# Patient Record
Sex: Female | Born: 1965 | Race: Black or African American | Hispanic: No | Smoking: Former smoker
Health system: Southern US, Community
[De-identification: ages and names within clinical notes are randomized; demographics above are authoritative.]

## PROBLEM LIST (undated history)

## (undated) DIAGNOSIS — I639 Cerebral infarction, unspecified: Secondary | ICD-10-CM

## (undated) DIAGNOSIS — E663 Overweight: Secondary | ICD-10-CM

## (undated) DIAGNOSIS — F4310 Post-traumatic stress disorder, unspecified: Secondary | ICD-10-CM

## (undated) DIAGNOSIS — I209 Angina pectoris, unspecified: Secondary | ICD-10-CM

## (undated) DIAGNOSIS — F329 Major depressive disorder, single episode, unspecified: Secondary | ICD-10-CM

## (undated) DIAGNOSIS — E538 Deficiency of other specified B group vitamins: Secondary | ICD-10-CM

## (undated) DIAGNOSIS — R32 Unspecified urinary incontinence: Secondary | ICD-10-CM

## (undated) DIAGNOSIS — R200 Anesthesia of skin: Secondary | ICD-10-CM

## (undated) DIAGNOSIS — Z6830 Body mass index (BMI) 30.0-30.9, adult: Secondary | ICD-10-CM

## (undated) DIAGNOSIS — I10 Essential (primary) hypertension: Secondary | ICD-10-CM

## (undated) DIAGNOSIS — E785 Hyperlipidemia, unspecified: Secondary | ICD-10-CM

## (undated) DIAGNOSIS — G43909 Migraine, unspecified, not intractable, without status migrainosus: Secondary | ICD-10-CM

## (undated) DIAGNOSIS — D649 Anemia, unspecified: Secondary | ICD-10-CM

## (undated) DIAGNOSIS — F32A Depression, unspecified: Secondary | ICD-10-CM

## (undated) DIAGNOSIS — J45909 Unspecified asthma, uncomplicated: Secondary | ICD-10-CM

## (undated) DIAGNOSIS — K047 Periapical abscess without sinus: Secondary | ICD-10-CM

## (undated) DIAGNOSIS — F1020 Alcohol dependence, uncomplicated: Secondary | ICD-10-CM

## (undated) DIAGNOSIS — E559 Vitamin D deficiency, unspecified: Secondary | ICD-10-CM

## (undated) DIAGNOSIS — L987 Excessive and redundant skin and subcutaneous tissue: Secondary | ICD-10-CM

## (undated) DIAGNOSIS — M542 Cervicalgia: Secondary | ICD-10-CM

## (undated) DIAGNOSIS — H3552 Pigmentary retinal dystrophy: Secondary | ICD-10-CM

## (undated) DIAGNOSIS — H547 Unspecified visual loss: Secondary | ICD-10-CM

## (undated) DIAGNOSIS — H9190 Unspecified hearing loss, unspecified ear: Secondary | ICD-10-CM

## (undated) DIAGNOSIS — F4321 Adjustment disorder with depressed mood: Secondary | ICD-10-CM

## (undated) DIAGNOSIS — G8929 Other chronic pain: Secondary | ICD-10-CM

## (undated) DIAGNOSIS — E213 Hyperparathyroidism, unspecified: Secondary | ICD-10-CM

## (undated) DIAGNOSIS — H5710 Ocular pain, unspecified eye: Secondary | ICD-10-CM

## (undated) DIAGNOSIS — H903 Sensorineural hearing loss, bilateral: Secondary | ICD-10-CM

## (undated) HISTORY — DX: Deficiency of other specified B group vitamins: E53.8

## (undated) HISTORY — DX: Excessive and redundant skin and subcutaneous tissue: L98.7

## (undated) HISTORY — PX: CHOLECYSTECTOMY: SHX55

## (undated) HISTORY — DX: Unspecified visual loss: H54.7

## (undated) HISTORY — DX: Periapical abscess without sinus: K04.7

## (undated) HISTORY — DX: Alcohol dependence, uncomplicated: F10.20

## (undated) HISTORY — DX: Depression, unspecified: F32.A

## (undated) HISTORY — DX: Cervicalgia: M54.2

## (undated) HISTORY — DX: Ocular pain, unspecified eye: H57.10

## (undated) HISTORY — DX: Angina pectoris, unspecified: I20.9

## (undated) HISTORY — DX: Pigmentary retinal dystrophy: H35.52

## (undated) HISTORY — DX: Hyperlipidemia, unspecified: E78.5

## (undated) HISTORY — DX: Unspecified hearing loss, unspecified ear: H91.90

## (undated) HISTORY — DX: Other chronic pain: G89.29

## (undated) HISTORY — DX: Overweight: E66.3

## (undated) HISTORY — DX: Post-traumatic stress disorder, unspecified: F43.10

## (undated) HISTORY — DX: Sensorineural hearing loss, bilateral: H90.3

## (undated) HISTORY — PX: TUBAL LIGATION: SHX77

## (undated) HISTORY — DX: Body mass index (BMI) 30.0-30.9, adult: Z68.30

## (undated) HISTORY — DX: Anemia, unspecified: D64.9

## (undated) HISTORY — DX: Adjustment disorder with depressed mood: F43.21

## (undated) HISTORY — PX: GASTRIC BYPASS: SHX52

## (undated) HISTORY — PX: COSMETIC SURGERY: SHX468

## (undated) HISTORY — DX: Vitamin D deficiency, unspecified: E55.9

## (undated) HISTORY — DX: Anesthesia of skin: R20.0

## (undated) HISTORY — PX: ABDOMINAL SURGERY: SHX537

## (undated) HISTORY — DX: Hyperparathyroidism, unspecified: E21.3

## (undated) HISTORY — DX: Unspecified urinary incontinence: R32

---

## 2015-12-21 ENCOUNTER — Emergency Department (HOSPITAL_COMMUNITY): Payer: Medicaid Other

## 2015-12-21 ENCOUNTER — Emergency Department (HOSPITAL_COMMUNITY)
Admission: EM | Admit: 2015-12-21 | Discharge: 2015-12-21 | Disposition: A | Discharge status: Home / Self Care | Payer: Medicaid Other | Attending: Emergency Medicine | Admitting: Emergency Medicine

## 2015-12-21 ENCOUNTER — Encounter (HOSPITAL_COMMUNITY): Payer: Self-pay | Admitting: Emergency Medicine

## 2015-12-21 DIAGNOSIS — N390 Urinary tract infection, site not specified: Secondary | ICD-10-CM | POA: Diagnosis not present

## 2015-12-21 DIAGNOSIS — I10 Essential (primary) hypertension: Secondary | ICD-10-CM | POA: Insufficient documentation

## 2015-12-21 DIAGNOSIS — D649 Anemia, unspecified: Secondary | ICD-10-CM | POA: Diagnosis not present

## 2015-12-21 DIAGNOSIS — N938 Other specified abnormal uterine and vaginal bleeding: Secondary | ICD-10-CM

## 2015-12-21 DIAGNOSIS — D259 Leiomyoma of uterus, unspecified: Secondary | ICD-10-CM | POA: Diagnosis not present

## 2015-12-21 DIAGNOSIS — R519 Headache, unspecified: Secondary | ICD-10-CM

## 2015-12-21 DIAGNOSIS — J45909 Unspecified asthma, uncomplicated: Secondary | ICD-10-CM | POA: Diagnosis not present

## 2015-12-21 DIAGNOSIS — R51 Headache: Secondary | ICD-10-CM

## 2015-12-21 DIAGNOSIS — R102 Pelvic and perineal pain: Secondary | ICD-10-CM

## 2015-12-21 HISTORY — DX: Migraine, unspecified, not intractable, without status migrainosus: G43.909

## 2015-12-21 HISTORY — DX: Unspecified asthma, uncomplicated: J45.909

## 2015-12-21 HISTORY — DX: Essential (primary) hypertension: I10

## 2015-12-21 LAB — URINALYSIS, ROUTINE W REFLEX MICROSCOPIC
Glucose, UA: NEGATIVE mg/dL
Ketones, ur: 15 mg/dL — AB
NITRITE: POSITIVE — AB
PROTEIN: 100 mg/dL — AB
Specific Gravity, Urine: 1.03 (ref 1.005–1.030)
pH: 5.5 (ref 5.0–8.0)

## 2015-12-21 LAB — CBC
HCT: 29.3 % — ABNORMAL LOW (ref 36.0–46.0)
Hemoglobin: 8.1 g/dL — ABNORMAL LOW (ref 12.0–15.0)
MCH: 16.2 pg — AB (ref 26.0–34.0)
MCHC: 27.6 g/dL — ABNORMAL LOW (ref 30.0–36.0)
MCV: 58.6 fL — ABNORMAL LOW (ref 78.0–100.0)
PLATELETS: 292 10*3/uL (ref 150–400)
RBC: 5 MIL/uL (ref 3.87–5.11)
RDW: 22.7 % — ABNORMAL HIGH (ref 11.5–15.5)
WBC: 7.3 10*3/uL (ref 4.0–10.5)

## 2015-12-21 LAB — URINE MICROSCOPIC-ADD ON

## 2015-12-21 LAB — COMPREHENSIVE METABOLIC PANEL
ALK PHOS: 107 U/L (ref 38–126)
ALT: 15 U/L (ref 14–54)
AST: 20 U/L (ref 15–41)
Albumin: 3.4 g/dL — ABNORMAL LOW (ref 3.5–5.0)
Anion gap: 7 (ref 5–15)
BUN: 14 mg/dL (ref 6–20)
CALCIUM: 9.4 mg/dL (ref 8.9–10.3)
CO2: 22 mmol/L (ref 22–32)
CREATININE: 0.62 mg/dL (ref 0.44–1.00)
Chloride: 107 mmol/L (ref 101–111)
Glucose, Bld: 99 mg/dL (ref 65–99)
Potassium: 3.3 mmol/L — ABNORMAL LOW (ref 3.5–5.1)
Sodium: 136 mmol/L (ref 135–145)
TOTAL PROTEIN: 6.3 g/dL — AB (ref 6.5–8.1)
Total Bilirubin: 1.1 mg/dL (ref 0.3–1.2)

## 2015-12-21 LAB — WET PREP, GENITAL
Clue Cells Wet Prep HPF POC: NONE SEEN
Sperm: NONE SEEN
Trich, Wet Prep: NONE SEEN
YEAST WET PREP: NONE SEEN

## 2015-12-21 LAB — POC URINE PREG, ED: Preg Test, Ur: NEGATIVE

## 2015-12-21 LAB — LIPASE, BLOOD: Lipase: 19 U/L (ref 11–51)

## 2015-12-21 MED ORDER — ALBUTEROL SULFATE HFA 108 (90 BASE) MCG/ACT IN AERS: 2.0000 | INHALATION_SPRAY | RESPIRATORY_TRACT | 0 refills | Status: DC | PRN

## 2015-12-21 MED ORDER — ALBUTEROL SULFATE (2.5 MG/3ML) 0.083% IN NEBU: 2.5000 mg | INHALATION_SOLUTION | Freq: Four times a day (QID) | RESPIRATORY_TRACT | 12 refills | Status: DC | PRN

## 2015-12-21 MED ORDER — NITROFURANTOIN MONOHYD MACRO 100 MG PO CAPS: 100.0000 mg | ORAL_CAPSULE | Freq: Two times a day (BID) | ORAL | 0 refills | Status: DC

## 2015-12-21 MED ORDER — KETOROLAC TROMETHAMINE 60 MG/2ML IM SOLN
30.0000 mg | Freq: Once | INTRAMUSCULAR | Status: AC
  Administered 2015-12-21: 30 mg via INTRAMUSCULAR
  Filled 2015-12-21: qty 2

## 2015-12-21 MED ORDER — NAPROXEN 500 MG PO TABS
500.0000 mg | ORAL_TABLET | Freq: Two times a day (BID) | ORAL | 0 refills | Status: DC
Start: 2015-12-21 — End: 2016-11-07

## 2015-12-21 NOTE — ED Notes (Signed)
Pelvic cart at bedside. 

## 2015-12-21 NOTE — ED Triage Notes (Addendum)
Pt via [facility] with c/o right side pain and abdomina pain x3 days. Pt is deaf. Vitals signs stable pt ambulatory at triage. NAD. Denies N/V.  Addendum: Pt reports vaginal bleeding and discharge.

## 2015-12-21 NOTE — ED Provider Notes (Signed)
[facility] DEPT Provider Note   CSN: [id number] Arrival date & time: 12/21/15  1341  First Provider Contact:  None       History   Chief Complaint Chief Complaint  Patient presents with  . Flank Pain  . Abdominal Pain  . Vaginal Bleeding    HPI Stacie Wolfe is a 50 y.o. female.  HPI Stacie Wolfe is a 50 y.o. female with history of asthma, hypertension, migraine headaches, deafness, presents to emergency department complaining of right-sided headaches, right-sided arm pain, lower abdominal cramping. Patient states her symptoms started 4 days ago. States headache and cramping started the same time. She states she noticed vaginal bleeding which was heavy initially. She states she had to change a pad every hour. She reports she has not had a menstrual cycle in over a year. She denies any irregular bleeding in the past. She states she is having lower abdominal pain that feels squeezing and feels like menstrual cramps. She denies any nausea or vomiting. She denies any change in her bowels. She denies any fever or chills. No urinary symptoms. She has tried over-the-counter medications name of which she does not remember. She denies any vaginal discharge. She is also requesting a refill of her albuterol inhaler, denies any respiratory symptoms at this time.  Patient is deaf and translator was used for history and exam.  Past Medical History:  Diagnosis Date  . Asthma   . Hypertension   . Migraine     There are no active problems to display for this patient.   Past Surgical History:  Procedure Laterality Date  . ABDOMINAL SURGERY    . CHOLECYSTECTOMY    . COSMETIC SURGERY    . GASTRIC BYPASS    . TUBAL LIGATION      OB History    No data available       Home Medications    Prior to Admission medications   Not on File    Family History No family history on file.  Social History Social History  Substance Use Topics  . Smoking status: Never Smoker  . Smokeless  tobacco: Never Used  . Alcohol use Yes     Comment: weekends      Allergies   Review of patient's allergies indicates no known allergies.   Review of Systems Review of Systems  Constitutional: Negative for chills and fever.  Respiratory: Negative for cough, chest tightness and shortness of breath.   Cardiovascular: Negative for chest pain, palpitations and leg swelling.  Gastrointestinal: Positive for abdominal pain. Negative for diarrhea, nausea and vomiting.  Genitourinary: Positive for pelvic pain and vaginal bleeding. Negative for dysuria, flank pain, vaginal discharge and vaginal pain.  Musculoskeletal: Positive for arthralgias and myalgias. Negative for neck pain and neck stiffness.  Skin: Negative for rash.  Neurological: Positive for headaches. Negative for dizziness and weakness.  All other systems reviewed and are negative.    Physical Exam Updated Vital Signs BP 155/99 (BP Location: Left Arm)   Pulse 77   Temp 98 F (36.7 C) (Oral)   Resp 20   LMP 12/21/2015 Comment: States she has not had cycle since 2016  SpO2 96%   Physical Exam  Constitutional: She appears well-developed and well-nourished. No distress.  HENT:  Head: Normocephalic.  Eyes: Conjunctivae and EOM are normal. Pupils are equal, round, and reactive to light.  Neck: Neck supple.  Cardiovascular: Normal rate, regular rhythm and normal heart sounds.   Pulmonary/Chest: Effort normal and breath sounds normal.  No respiratory distress. She has no wheezes. She has no rales.  Abdominal: Soft. Bowel sounds are normal. She exhibits no distension. There is tenderness. There is no rebound.  Suprapubic tenderness  Genitourinary:  Genitourinary Comments: Normal external genitalia, normal vaginal canal, small bleeding in the vaginal canal and cervix. Positive cervical motion tenderness, suprapubic tenderness, bilateral adnexal tenderness worse on the left.  Musculoskeletal: She exhibits no edema.  Neurological:  She is alert.  Skin: Skin is warm and dry.  Psychiatric: She has a normal mood and affect. Her behavior is normal.  Nursing note and vitals reviewed.    ED Treatments / Results  Labs (all labs ordered are listed, but only abnormal results are displayed) Labs Reviewed  COMPREHENSIVE METABOLIC PANEL - Abnormal; Notable for the following:       Result Value   Potassium 3.3 (*)    Total Protein 6.3 (*)    Albumin 3.4 (*)    All other components within normal limits  CBC - Abnormal; Notable for the following:    Hemoglobin 8.1 (*)    HCT 29.3 (*)    MCV 58.6 (*)    MCH 16.2 (*)    MCHC 27.6 (*)    RDW 22.7 (*)    All other components within normal limits  URINALYSIS, ROUTINE W REFLEX MICROSCOPIC (NOT AT [facility]) - Abnormal; Notable for the following:    Color, Urine RED (*)    APPearance TURBID (*)    Hgb urine dipstick LARGE (*)    Bilirubin Urine MODERATE (*)    Ketones, ur 15 (*)    Protein, ur 100 (*)    Nitrite POSITIVE (*)    Leukocytes, UA MODERATE (*)    All other components within normal limits  URINE MICROSCOPIC-ADD ON - Abnormal; Notable for the following:    Squamous Epithelial / LPF 0-5 (*)    Bacteria, UA MANY (*)    All other components within normal limits  WET PREP, GENITAL  LIPASE, BLOOD  POC URINE PREG, ED  GC/CHLAMYDIA PROBE AMP ([place]) NOT AT [facility]    EKG  EKG Interpretation None       Radiology US Transvaginal Non-ob  Result Date: 12/21/2015 CLINICAL DATA:  Pelvic pain for 3 days. Gravida 2 para 2. LMP began on 12/18/2015. EXAM: TRANSABDOMINAL AND TRANSVAGINAL ULTRASOUND OF PELVIS TECHNIQUE: Both transabdominal and transvaginal ultrasound examinations of the pelvis were performed. Transabdominal technique was performed for global imaging of the pelvis including uterus, ovaries, adnexal regions, and pelvic cul-de-sac. It was necessary to proceed with endovaginal exam following the transabdominal exam to visualize the uterus, endometrium,  ovaries, adnexal regions. COMPARISON:  None FINDINGS: Uterus Measurements: At least 8.6 x 6.1 x 7.4 cm. Globular in appearance with numerous fibroids. Measured fibroids are 3.2 x 3.2 x 3.1 cm anteriorly, 3.8 x 3.8 x 3.7 cm anteriorly and superiorly Endometrium Thickness: 6.9 mm.  No focal abnormality visualized. Right ovary Measurements: 1.6 x 1.0 x 1.7 cm. Normal appearance/no adnexal mass. Left ovary Measurements: 2.2 x 1.9 x 2.3 cm. Normal appearance/no adnexal mass. Other findings No abnormal free fluid. Study quality is degraded by multiple bowel loops around the ovaries, limiting visualization. IMPRESSION: 1. Uterus is enlarged by fibroids the largest measuring 3.8 cm. 2. Normal appearance of the endometrial stripe. 3. Ovaries appear to be normal in size. Electronically Signed   By: Nolon Nations M.D.   On: 12/21/2015 22:07   US Pelvis Complete  Result Date: 12/21/2015 CLINICAL DATA:  Pelvic  pain for 3 days. Gravida 2 para 2. LMP began on 12/18/2015. EXAM: TRANSABDOMINAL AND TRANSVAGINAL ULTRASOUND OF PELVIS TECHNIQUE: Both transabdominal and transvaginal ultrasound examinations of the pelvis were performed. Transabdominal technique was performed for global imaging of the pelvis including uterus, ovaries, adnexal regions, and pelvic cul-de-sac. It was necessary to proceed with endovaginal exam following the transabdominal exam to visualize the uterus, endometrium, ovaries, adnexal regions. COMPARISON:  None FINDINGS: Uterus Measurements: At least 8.6 x 6.1 x 7.4 cm. Globular in appearance with numerous fibroids. Measured fibroids are 3.2 x 3.2 x 3.1 cm anteriorly, 3.8 x 3.8 x 3.7 cm anteriorly and superiorly Endometrium Thickness: 6.9 mm.  No focal abnormality visualized. Right ovary Measurements: 1.6 x 1.0 x 1.7 cm. Normal appearance/no adnexal mass. Left ovary Measurements: 2.2 x 1.9 x 2.3 cm. Normal appearance/no adnexal mass. Other findings No abnormal free fluid. Study quality is degraded by  multiple bowel loops around the ovaries, limiting visualization. IMPRESSION: 1. Uterus is enlarged by fibroids the largest measuring 3.8 cm. 2. Normal appearance of the endometrial stripe. 3. Ovaries appear to be normal in size. Electronically Signed   By: Norva PavlovElizabeth  Brown M.D.   On: 12/21/2015 22:07    Procedures Procedures (including critical care time)  Medications Ordered in ED Medications  ketorolac (TORADOL) injection 30 mg (30 mg Intramuscular Given 12/21/15 1921)     Initial Impression / Assessment and Plan / ED Course  I have reviewed the triage vital signs and the nursing notes.  Pertinent labs & imaging results that were available during my care of the patient were reviewed by me and considered in my medical decision making (see chart for details).  Clinical Course  Comment By Time  Patient with lower abdominal cramping, vaginal bleeding, headache. Will try Toradol for pain and headache. Labs pending. Pelvic exam with positive cervical motion tenderness and notes of tenderness, will get ultrasound pelvis. Jaynie Crumbleatyana Kanyon Seibold, PA-C 08/10 1931  Patient's hemoglobin is 8. I'm not sure what patient's baseline is, no prior labs. She denies dizziness or lightheadedness. I suspect chronic anemia versus blood loss from her uterine bleeding. She is asymptomatic for this anemia. Patient with many bacteria in her urine analysis. Some contamination present, however will treat given suprapubic cramping. Pelvic exam was significant tenderness over her uterus and adnexa. Will get ultrasound. Jaynie Crumbleatyana Ardith Test, PA-C 08/10 2100  Ultrasound showing fibroids. Patient states she feels better, headache and abdominal cramping improved after Toradol. She is mildly hypertensive otherwise normal vital signs. She states that her bleeding is subsiding at this time. I instructed her to follow-up with OB/GYN to recheck hemoglobin for further evaluation of dysfunctional uterine bleeding and fibroids. We'll place on  Macrobid for UTI, naproxen for abdominal cramping, close follow-up. Return precautions discussed. Jaynie Crumbleatyana Batsheva Stevick, PA-C 08/10 2254      Final Clinical Impressions(s) / ED Diagnoses   Final diagnoses:  UTI (lower urinary tract infection)  Anemia, unspecified anemia type  Dysfunctional uterine bleeding  Acute nonintractable headache, unspecified headache type  Uterine leiomyoma, unspecified location    New Prescriptions Discharge Medication List as of 12/21/2015 10:58 PM    START taking these medications   Details  naproxen (NAPROSYN) 500 MG tablet Take 1 tablet (500 mg total) by mouth 2 (two) times daily., Starting Thu 12/21/2015, Print    nitrofurantoin, macrocrystal-monohydrate, (MACROBID) 100 MG capsule Take 1 capsule (100 mg total) by mouth 2 (two) times daily., Starting Thu 12/21/2015, Print         Jaynie Crumbleatyana Deserae Jennings, PA-C  12/22/15 0149    Daleen Bo, MD 12/22/15 2024

## 2015-12-21 NOTE — Discharge Instructions (Signed)
Take macrobid for urinary tract infection until all gone. Naprosyn for pain and cramping. Follow up with OB/GYN or primary care doctor[facility]

## 2015-12-21 NOTE — ED Notes (Signed)
Pt called from triage x3 no answer.

## 2015-12-21 NOTE — ED Notes (Signed)
Pelvic in progress

## 2015-12-21 NOTE — ED Notes (Signed)
Pt called for v/s recheck no answer from lobby

## 2015-12-21 NOTE — ED Notes (Signed)
ED PA at bedside

## 2015-12-22 LAB — GC/CHLAMYDIA PROBE AMP (~~LOC~~) NOT AT ARMC
Chlamydia: NEGATIVE
Neisseria Gonorrhea: NEGATIVE

## 2016-02-02 ENCOUNTER — Encounter (HOSPITAL_COMMUNITY): Payer: Self-pay

## 2016-02-02 ENCOUNTER — Emergency Department (HOSPITAL_COMMUNITY)
Admission: EM | Admit: 2016-02-02 | Discharge: 2016-02-03 | Disposition: A | Discharge status: Home / Self Care | Payer: Medicaid Other | Attending: Emergency Medicine | Admitting: Emergency Medicine

## 2016-02-02 ENCOUNTER — Emergency Department (HOSPITAL_COMMUNITY): Payer: Medicaid Other

## 2016-02-02 DIAGNOSIS — D259 Leiomyoma of uterus, unspecified: Secondary | ICD-10-CM | POA: Insufficient documentation

## 2016-02-02 DIAGNOSIS — J45909 Unspecified asthma, uncomplicated: Secondary | ICD-10-CM | POA: Diagnosis not present

## 2016-02-02 DIAGNOSIS — Z7982 Long term (current) use of aspirin: Secondary | ICD-10-CM | POA: Insufficient documentation

## 2016-02-02 DIAGNOSIS — I10 Essential (primary) hypertension: Secondary | ICD-10-CM | POA: Diagnosis not present

## 2016-02-02 DIAGNOSIS — R1030 Lower abdominal pain, unspecified: Secondary | ICD-10-CM

## 2016-02-02 LAB — I-STAT CHEM 8, ED
BUN: 6 mg/dL (ref 6–20)
CHLORIDE: 110 mmol/L (ref 101–111)
CREATININE: 0.5 mg/dL (ref 0.44–1.00)
Calcium, Ion: 1.29 mmol/L (ref 1.15–1.40)
Glucose, Bld: 84 mg/dL (ref 65–99)
HEMATOCRIT: 27 % — AB (ref 36.0–46.0)
Hemoglobin: 9.2 g/dL — ABNORMAL LOW (ref 12.0–15.0)
POTASSIUM: 4.1 mmol/L (ref 3.5–5.1)
SODIUM: 140 mmol/L (ref 135–145)
TCO2: 24 mmol/L (ref 0–100)

## 2016-02-02 LAB — CBC
HEMATOCRIT: 28.5 % — AB (ref 36.0–46.0)
HEMOGLOBIN: 7.5 g/dL — AB (ref 12.0–15.0)
MCH: 15.8 pg — ABNORMAL LOW (ref 26.0–34.0)
MCHC: 26.3 g/dL — AB (ref 30.0–36.0)
MCV: 60 fL — ABNORMAL LOW (ref 78.0–100.0)
Platelets: 360 10*3/uL (ref 150–400)
RBC: 4.75 MIL/uL (ref 3.87–5.11)
RDW: 23.5 % — ABNORMAL HIGH (ref 11.5–15.5)
WBC: 8.1 10*3/uL (ref 4.0–10.5)

## 2016-02-02 LAB — URINALYSIS, ROUTINE W REFLEX MICROSCOPIC
Bilirubin Urine: NEGATIVE
Glucose, UA: NEGATIVE mg/dL
HGB URINE DIPSTICK: NEGATIVE
Ketones, ur: NEGATIVE mg/dL
LEUKOCYTES UA: NEGATIVE
NITRITE: NEGATIVE
PROTEIN: NEGATIVE mg/dL
SPECIFIC GRAVITY, URINE: 1.019 (ref 1.005–1.030)
pH: 6 (ref 5.0–8.0)

## 2016-02-02 LAB — I-STAT BETA HCG BLOOD, ED (MC, WL, AP ONLY): I-stat hCG, quantitative: <5 m[IU]/mL (ref <5)

## 2016-02-02 MED ORDER — MORPHINE SULFATE (PF) 4 MG/ML IV SOLN
4.0000 mg | Freq: Once | INTRAVENOUS | Status: AC
  Administered 2016-02-02: 4 mg via INTRAVENOUS
  Filled 2016-02-02: qty 1

## 2016-02-02 MED ORDER — IOPAMIDOL (ISOVUE-300) INJECTION 61%
INTRAVENOUS | Status: AC
  Administered 2016-02-02: 100 mL
  Filled 2016-02-02: qty 100

## 2016-02-02 MED ORDER — HYDROCODONE-ACETAMINOPHEN 5-325 MG PO TABS: 1.0000 | ORAL_TABLET | Freq: Four times a day (QID) | ORAL | 0 refills | Status: AC | PRN

## 2016-02-02 MED ORDER — KETOROLAC TROMETHAMINE 15 MG/ML IJ SOLN
15.0000 mg | Freq: Once | INTRAMUSCULAR | Status: AC
  Administered 2016-02-02: 15 mg via INTRAVENOUS
  Filled 2016-02-02: qty 1

## 2016-02-02 NOTE — ED Triage Notes (Signed)
Pt brought in EMS for lower abdominal pain and htn.  Pt reports that she was diagnosed with fibroids but is waiting for insurance to kick in so she can get prescribed medications.  She also is c/o foot pain but has been diagnosed with neuropathy.  Pt can not afford medications until insurance kicks in.  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  Video interpreter used at this time.

## 2016-02-02 NOTE — ED Provider Notes (Signed)
[place] [facility] DEPT Provider Note   CSN: [id number] Arrival date & time: 02/02/16  1813     History   Chief Complaint Chief Complaint  Patient presents with  . Headache  . Abdominal Pain    HPI Stacie Wolfe is a 50 y.o. female.  Patient presents with lower abdominal pain. Previously diagnosed with fibroids.   The history is provided by the patient. The history is limited by a language barrier. A language interpreter was used (sign language).  Abdominal Cramping  This is a new problem. The current episode started more than 1 week ago. The problem occurs constantly. The problem has been gradually worsening. Associated symptoms include abdominal pain and headaches. Pertinent negatives include no chest pain and no shortness of breath. Nothing aggravates the symptoms. Nothing relieves the symptoms.    Past Medical History:  Diagnosis Date  . Asthma   . Hypertension   . Migraine     There are no active problems to display for this patient.   Past Surgical History:  Procedure Laterality Date  . ABDOMINAL SURGERY    . CHOLECYSTECTOMY    . COSMETIC SURGERY    . GASTRIC BYPASS    . TUBAL LIGATION      OB History    No data available       Home Medications    Prior to Admission medications   Medication Sig Start Date End Date Taking? Authorizing Provider  Aspirin-Salicylamide-Caffeine (BC FAST PAIN RELIEF) [phone] MG PACK Take 1 Package by mouth daily as needed (pain).   Yes Historical Provider, MD  albuterol (PROVENTIL HFA;VENTOLIN HFA) 108 (90 Base) MCG/ACT inhaler Inhale 2 puffs into the lungs every 4 (four) hours as needed for wheezing or shortness of breath. 12/21/15   Tatyana Kirichenko, PA-C  albuterol (PROVENTIL) (2.5 MG/3ML) 0.083% nebulizer solution Take 3 mLs (2.5 mg total) by nebulization every 6 (six) hours as needed for wheezing or shortness of breath. 12/21/15   Tatyana Kirichenko, PA-C  amitriptyline (ELAVIL) 25 MG tablet Take 25 mg by mouth at  bedtime.    Historical Provider, MD  naproxen (NAPROSYN) 500 MG tablet Take 1 tablet (500 mg total) by mouth 2 (two) times daily. Patient not taking: Reported on 02/02/2016 12/21/15   Tatyana Kirichenko, PA-C  nitrofurantoin, macrocrystal-monohydrate, (MACROBID) 100 MG capsule Take 1 capsule (100 mg total) by mouth 2 (two) times daily. Patient not taking: Reported on 02/02/2016 12/21/15   Jeannett Senior, PA-C    Family History History reviewed. No pertinent family history.  Social History Social History  Substance Use Topics  . Smoking status: Never Smoker  . Smokeless tobacco: Never Used  . Alcohol use Yes     Comment: weekends      Allergies   Review of patient's allergies indicates no known allergies.   Review of Systems Review of Systems  Constitutional: Negative for chills and fever.  HENT: Negative for ear pain and sore throat.   Eyes: Negative for pain and visual disturbance.  Respiratory: Negative for cough and shortness of breath.   Cardiovascular: Negative for chest pain and palpitations.  Gastrointestinal: Positive for abdominal pain. Negative for vomiting.  Genitourinary: Negative for dysuria, hematuria, vaginal bleeding and vaginal discharge.  Musculoskeletal: Negative for arthralgias and back pain.  Skin: Negative for color change and rash.  Neurological: Positive for headaches. Negative for seizures and syncope.  All other systems reviewed and are negative.    Physical Exam Updated Vital Signs BP 147/76   Pulse 75   Temp  98.5 F (36.9 C) (Oral)   Resp 17   Ht 5\' 6"  (1.676 m)   Wt 81.6 kg   SpO2 99%   BMI 29.05 kg/m   Physical Exam  Constitutional: She appears well-developed and well-nourished. No distress.  HENT:  Head: Normocephalic and atraumatic.  Eyes: Conjunctivae and EOM are normal.  Neck: Normal range of motion. Neck supple.  Cardiovascular: Normal rate and regular rhythm.   No murmur heard. Pulmonary/Chest: Effort normal and breath  sounds normal. No respiratory distress.  Abdominal: Soft. There is tenderness (suprapubic). There is no rebound and no guarding.  Musculoskeletal: She exhibits no edema.  Neurological: She is alert.  Skin: Skin is warm and dry.  Psychiatric: She has a normal mood and affect.  Nursing note and vitals reviewed.    ED Treatments / Results  Labs (all labs ordered are listed, but only abnormal results are displayed) Labs Reviewed  CBC - Abnormal; Notable for the following:       Result Value   Hemoglobin 7.5 (*)    HCT 28.5 (*)    MCV 60.0 (*)    MCH 15.8 (*)    MCHC 26.3 (*)    RDW 23.5 (*)    All other components within normal limits  URINALYSIS, ROUTINE W REFLEX MICROSCOPIC (NOT AT [facility]) - Abnormal; Notable for the following:    APPearance HAZY (*)    All other components within normal limits  I-STAT CHEM 8, ED - Abnormal; Notable for the following:    Hemoglobin 9.2 (*)    HCT 27.0 (*)    All other components within normal limits  I-STAT BETA HCG BLOOD, ED ([facility], [facility], [facility] ONLY)    EKG  EKG Interpretation None       Radiology No results found.  Procedures Procedures (including critical care time)  Medications Ordered in ED Medications  ketorolac (TORADOL) 15 MG/ML injection 15 mg (15 mg Intravenous Given 02/02/16 1945)  morphine 4 MG/ML injection 4 mg (4 mg Intravenous Given 02/02/16 2150)  iopamidol (ISOVUE-300) 61 % injection (100 mLs  Contrast Given 02/02/16 2201)     Initial Impression / Assessment and Plan / ED Course  I have reviewed the triage vital signs and the nursing notes.  Pertinent labs & imaging results that were available during my care of the patient were reviewed by me and considered in my medical decision making (see chart for details).  Clinical Course    Patient presents for continued lower abdominal pain that was recently evaluated and found to be uterine fibroids.  She also complains of headache.  The headache is no sudden in onset or  maximal at onset, doubt SAH. No focal neurological findings.  No neck stiffness or fevers, doubt meningitis.  Patient treated with toradol with minimal improvement in headache or abdominal pain.  Given morphine with resolution of headache but not complete resolution of abdominal pain.  CT abdomen/pelvis obtained.  Demonstrates no acute findings; redemonstrates uterine fibroids.  Patient encouraged to follow up with her ob/gyn.  She has an established appointment on 10/4, but is encouraged to obtain a sooner appointment if possible.  Given prescription for oral narcotic pain medications and encouraged to use non-medicinal pain relief techniques such as warm compresses.  Patient is given strict return precautions, follow up instruction and educational materials.  Final Clinical Impressions(s) / ED Diagnoses   Final diagnoses:  Lower abdominal pain  Uterine leiomyoma, unspecified location    New Prescriptions New Prescriptions   HYDROCODONE-ACETAMINOPHEN (  NORCO/VICODIN) 5-325 MG TABLET    Take 1 tablet by mouth every 6 (six) hours as needed for moderate pain or severe pain.     Elveria Rising, MD 02/02/16 [id number]    Dorie Rank, MD 02/03/16 [phone]

## 2016-02-02 NOTE — ED Notes (Addendum)
Pt communicating through writing. Pt states pain is no better after Toradol. Steady gait to BR, pt denies vaginal bleeding

## 2016-02-02 NOTE — ED Notes (Signed)
Pt to CT via stretcher

## 2016-02-02 NOTE — ED Notes (Signed)
Pt returned from CT via stretcher.

## 2016-08-12 ENCOUNTER — Encounter (HOSPITAL_COMMUNITY): Payer: Self-pay

## 2016-08-12 ENCOUNTER — Emergency Department (HOSPITAL_COMMUNITY)
Admission: EM | Admit: 2016-08-12 | Discharge: 2016-08-12 | Disposition: A | Discharge status: Home / Self Care | Payer: Medicaid Other | Attending: Emergency Medicine | Admitting: Emergency Medicine

## 2016-08-12 DIAGNOSIS — Y999 Unspecified external cause status: Secondary | ICD-10-CM | POA: Diagnosis not present

## 2016-08-12 DIAGNOSIS — W01198A Fall on same level from slipping, tripping and stumbling with subsequent striking against other object, initial encounter: Secondary | ICD-10-CM | POA: Insufficient documentation

## 2016-08-12 DIAGNOSIS — Y929 Unspecified place or not applicable: Secondary | ICD-10-CM | POA: Insufficient documentation

## 2016-08-12 DIAGNOSIS — I10 Essential (primary) hypertension: Secondary | ICD-10-CM | POA: Insufficient documentation

## 2016-08-12 DIAGNOSIS — S0993XA Unspecified injury of face, initial encounter: Secondary | ICD-10-CM | POA: Diagnosis present

## 2016-08-12 DIAGNOSIS — J45909 Unspecified asthma, uncomplicated: Secondary | ICD-10-CM | POA: Diagnosis not present

## 2016-08-12 DIAGNOSIS — Z79899 Other long term (current) drug therapy: Secondary | ICD-10-CM | POA: Insufficient documentation

## 2016-08-12 DIAGNOSIS — Y939 Activity, unspecified: Secondary | ICD-10-CM | POA: Diagnosis not present

## 2016-08-12 DIAGNOSIS — S01501A Unspecified open wound of lip, initial encounter: Secondary | ICD-10-CM | POA: Insufficient documentation

## 2016-08-12 DIAGNOSIS — Z7982 Long term (current) use of aspirin: Secondary | ICD-10-CM | POA: Diagnosis not present

## 2016-08-12 DIAGNOSIS — Z23 Encounter for immunization: Secondary | ICD-10-CM | POA: Diagnosis not present

## 2016-08-12 HISTORY — DX: Unspecified hearing loss, unspecified ear: H91.90

## 2016-08-12 MED ORDER — TETANUS-DIPHTH-ACELL PERTUSSIS 5-2.5-18.5 LF-MCG/0.5 IM SUSP
0.5000 mL | Freq: Once | INTRAMUSCULAR | Status: AC
  Administered 2016-08-12: 0.5 mL via INTRAMUSCULAR
  Filled 2016-08-12: qty 0.5

## 2016-08-12 MED ORDER — AMOXICILLIN 500 MG PO CAPS: 500.0000 mg | ORAL_CAPSULE | Freq: Two times a day (BID) | ORAL | 0 refills | Status: AC

## 2016-08-12 NOTE — ED Provider Notes (Signed)
[facility] DEPT Provider Note   CSN: [id number] Arrival date & time: 08/12/16  0350  By signing my name below, I, Hansel Feinstein, attest that this documentation has been prepared under the direction and in the presence of  Providence Lanius, PA-C. Electronically Signed: Hansel Feinstein, ED Scribe. 08/12/16. 10:45 AM.   History   Chief Complaint Chief Complaint  Patient presents with  . Fall    HPI Stacie Wolfe is a 51 y.o. female who presents to the Emergency Department complaining gradually worsening lower lip pain associated with a wound s/p a mechanical fall that occurred 0300 on 08/12/15. Pt states she tripped over a lamp cord, fell forward and struck her face on the floor. She denies LOC or head injury. Patient reports that since the incident, she has experienced progressively worsening pain from wound. She did note some drainage to it yesterday morning, but has kept it covered and has not noticed any more since. Patient also reports feeling like her front teeth feel loose. Per pt, her lip pain is worsened with biting down. Pt states she has taken Advil with no relief. She is tolerating fluids, but has difficulty eating secondary to pain. Pt has h/o asthma, but is otherwise healthy. Pt denies fever, chills, HA, CP, abdominal pain, additional injuries. Patient does not recall when her last tetanus was.    The history is provided by the patient. A language interpreter was used (ASL).    Past Medical History:  Diagnosis Date  . Asthma   . Deaf   . Hypertension   . Migraine     There are no active problems to display for this patient.   Past Surgical History:  Procedure Laterality Date  . ABDOMINAL SURGERY    . CHOLECYSTECTOMY    . COSMETIC SURGERY    . GASTRIC BYPASS    . TUBAL LIGATION      OB History    No data available       Home Medications    Prior to Admission medications   Medication Sig Start Date End Date Taking? Authorizing Provider  albuterol (PROVENTIL  HFA;VENTOLIN HFA) 108 (90 Base) MCG/ACT inhaler Inhale 2 puffs into the lungs every 4 (four) hours as needed for wheezing or shortness of breath. 12/21/15   Tatyana Kirichenko, PA-C  albuterol (PROVENTIL) (2.5 MG/3ML) 0.083% nebulizer solution Take 3 mLs (2.5 mg total) by nebulization every 6 (six) hours as needed for wheezing or shortness of breath. 12/21/15   Tatyana Kirichenko, PA-C  amitriptyline (ELAVIL) 25 MG tablet Take 25 mg by mouth at bedtime.    Historical Provider, MD  amoxicillin (AMOXIL) 500 MG capsule Take 1 capsule (500 mg total) by mouth 2 (two) times daily. 08/12/16 08/19/16  Volanda Napoleon, PA-C  Aspirin-Salicylamide-Caffeine (BC FAST PAIN RELIEF) 650-195-33.3 MG PACK Take 1 Package by mouth daily as needed (pain).    Historical Provider, MD  naproxen (NAPROSYN) 500 MG tablet Take 1 tablet (500 mg total) by mouth 2 (two) times daily. Patient not taking: Reported on 02/02/2016 12/21/15   Tatyana Kirichenko, PA-C  nitrofurantoin, macrocrystal-monohydrate, (MACROBID) 100 MG capsule Take 1 capsule (100 mg total) by mouth 2 (two) times daily. Patient not taking: Reported on 02/02/2016 12/21/15   Jeannett Senior, PA-C    Family History No family history on file.  Social History Social History  Substance Use Topics  . Smoking status: Never Smoker  . Smokeless tobacco: Never Used  . Alcohol use Yes     Comment: weekends  Allergies   Patient has no known allergies.   Review of Systems Review of Systems  Constitutional: Negative for chills and fever.  HENT: Positive for dental problem. Negative for congestion.   Eyes: Negative for visual disturbance.  Respiratory: Negative for shortness of breath.   Cardiovascular: Negative for chest pain.  Gastrointestinal: Negative for abdominal pain, nausea and vomiting.  Genitourinary: Negative for dysuria and hematuria.  Skin: Positive for wound. Negative for rash.  Neurological: Negative for weakness, numbness and headaches.  All  other systems reviewed and are negative.   Physical Exam Updated Vital Signs BP (!) 188/79   Pulse 84   Temp 98.3 F (36.8 C) (Oral)   Resp 16   SpO2 100%   Physical Exam  Constitutional: She appears well-developed and well-nourished.  HENT:  Head: Normocephalic.  Mouth/Throat: Oropharynx is clear and moist and mucous membranes are normal. Abnormal dentition. Lacerations present. No dental abscesses.    2.5 cm linear old wound the lower lip that is scabbed over with dried blood. Does not go through to interior portion of mouth. No drainage from wound.   Slight soft tissue swelling to the lower lip. No fluctuance noted.   Eyes: Conjunctivae and EOM are normal. Right eye exhibits no discharge. Left eye exhibits no discharge. No scleral icterus.  Neck: Full passive range of motion without pain. Neck supple. No spinous process tenderness and no muscular tenderness present.  Cardiovascular: Normal rate, regular rhythm and intact distal pulses.   Radial pulses 2+ and equal.   Pulmonary/Chest: Effort normal and breath sounds normal.  Musculoskeletal: She exhibits no deformity.  FROM of bilateral elbows and wrists without pain. No bony tenderness. No obvious deformity.   Neurological: She is alert.  Skin: Skin is warm and dry.  Psychiatric: She has a normal mood and affect. Her speech is normal and behavior is normal.  Nursing note and vitals reviewed.   ED Treatments / Results   DIAGNOSTIC STUDIES: Oxygen Saturation is 98% on RA, normal by my interpretation.    COORDINATION OF CARE: Discussed treatment plan with pt at bedside which includes antibiotic, dental f/u and pt agreed to plan.    Labs (all labs ordered are listed, but only abnormal results are displayed) Labs Reviewed - No data to display  EKG  EKG Interpretation None       Radiology No results found.  Procedures Procedures (including critical care time)  Medications Ordered in ED Medications  Tdap  (BOOSTRIX) injection 0.5 mL (0.5 mLs Intramuscular Given 08/12/16 1205)     Initial Impression / Assessment and Plan / ED Course  I have reviewed the triage vital signs and the nursing notes.  Pertinent labs & imaging results that were available during my care of the patient were reviewed by me and considered in my medical decision making (see chart for details).     51 yo F presents with lower lip wound that occurred 2 days s/p mechanical fall. Physical exam with 2.5 cm wound to the lower lip, appears to be from a bite sustained as she fell down. Wound is scabbed over with dried blood. Given duration of injury and physical exam no indication for repair at this time. No fluctuance noted on exam. Will not I&D at this time.  Will send patient home with amoxicillin for coverage. Instructed patient to use warm compresses and NSAIDs for symptomatic relief. Will also provide a list of dental clinics that she can contact regarding loose teeth. Instructed patient to return  to the ED for any worsening swelling, redness, fevers, pain or any other concerns. Patient appears stable for discharge at this time. Return precautions discussed and outlined in discharge paperwork with ASL interpreter. Patient is agreeable to plan.     Final Clinical Impressions(s) / ED Diagnoses   Final diagnoses:  Open wound of lip, unspecified open wound type, initial encounter    New Prescriptions Discharge Medication List as of 08/12/2016 12:01 PM    START taking these medications   Details  amoxicillin (AMOXIL) 500 MG capsule Take 1 capsule (500 mg total) by mouth 2 (two) times daily., Starting Mon 08/12/2016, Until Mon 08/19/2016, Print        I personally performed the services described in this documentation, which was scribed in my presence. The recorded information has been reviewed and is accurate.    Volanda Napoleon, PA-C 08/12/16 2130    Julianne Rice, MD 08/14/16 [phone]

## 2016-08-12 NOTE — ED Notes (Signed)
Patient called again with no answer.

## 2016-08-12 NOTE — ED Notes (Signed)
Assessment done with PA at bedside with the SL interpreter machine

## 2016-08-12 NOTE — Discharge Instructions (Signed)
°  Please follow-up with one of the referred dentists listed below. Call them today and tell them you were seen in the emergency department   Please take all of your antibiotics until finished!   You can use warm compresses to the lip to help with the swelling.   Follow-up with you primary care doctor in 2 days for follow-up.   Return to the Emergency Dept if you experience any fevers, worsening swelling, drainage from the lip or any other concerns   Dental Assistance If the dentist on-call cannot see you, please use the resources below:   Patients with Medicaid: [place] [address], [place] [address] [phone]  If unable to pay, or uninsured, contact [facility] [phone]) or [address] [phone] in [place], [place] in [facility]) to become qualified for the adult dental clinic  Other [place]- [place], [place], [place], [address]    [phone], Ext. [phone]    2nd and 4th Thursday of the month at 6:30am    10 clients each day by appointment, can sometimes see walk-in     patients if someone does not show for an appointment [place], [place], [place], [address]    [phone] [facility]- [address], [place], [place], [address]    [phone]  [facility] Department- [phone] [facility] Department- [phone]

## 2016-08-12 NOTE — ED Notes (Signed)
Papers reviewed with PA and interpreter. Patients friend at bedside to take her home. Tdap given and counseling done with interpreter as well

## 2016-08-12 NOTE — ED Notes (Signed)
Gauze covering applied per patients request

## 2016-08-12 NOTE — ED Triage Notes (Signed)
PTAR- pt fell yesterday, tripped over TV cord and busted her lip. Lip is swollen with some drainage noted. Pt is deaf and speaks sign language. [employer] interpreter used.

## 2016-08-12 NOTE — ED Notes (Signed)
Called patient x 3 in all areas of waiting room with no answer

## 2016-08-12 NOTE — ED Notes (Signed)
Patient is deaf, WALLEE placed at bedside

## 2016-10-03 IMAGING — CT CT ABD-PELV W/ CM
2 of 5 series · 17 of 46 positions shown, 19 images · IV contrast (APPLIED)
Comparison: None.

CLINICAL DATA: Lower abdominal pain radiating to the right lower
quadrant for 2 days. History of abdominal surgery, tubal ligation,
cholecystectomy, and gastric bypass.

EXAM:
CT ABDOMEN AND PELVIS WITH CONTRAST
TECHNIQUE: Multidetector CT imaging of the abdomen and pelvis was performed
using the standard protocol following bolus administration of
intravenous contrast.
CONTRAST:  100 ml [id number] IOPAMIDOL ([id number]) INJECTION 61%

[Series 2: abd/ pelvis 5.0 i30f 1 · axial · 0.98mm/px · z∈[+924,+1359]mm · 14 of 97 slices shown, 16 images]
[im 5/97  soft-tissue]
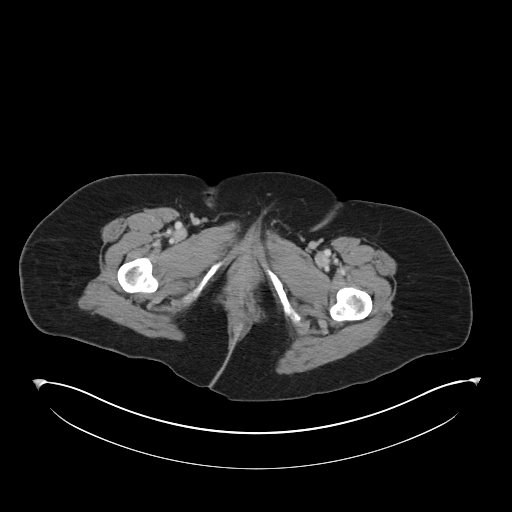
[im 5/97  bone]
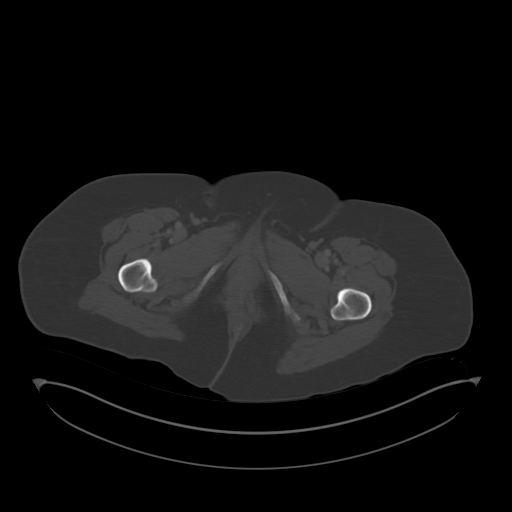
[im 15/97  soft-tissue]
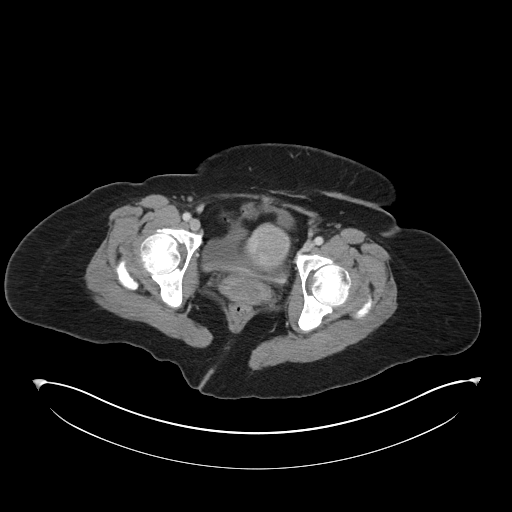
[im 20/97  soft-tissue]
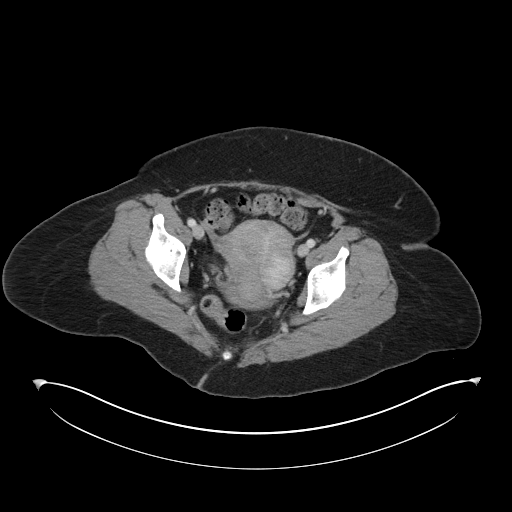
[im 25/97  soft-tissue]
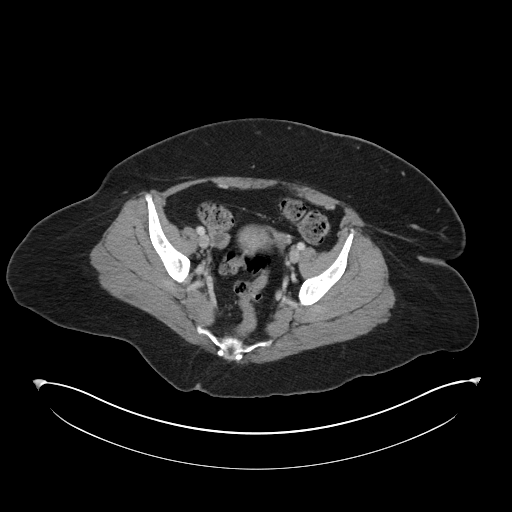
[im 34/97  soft-tissue]
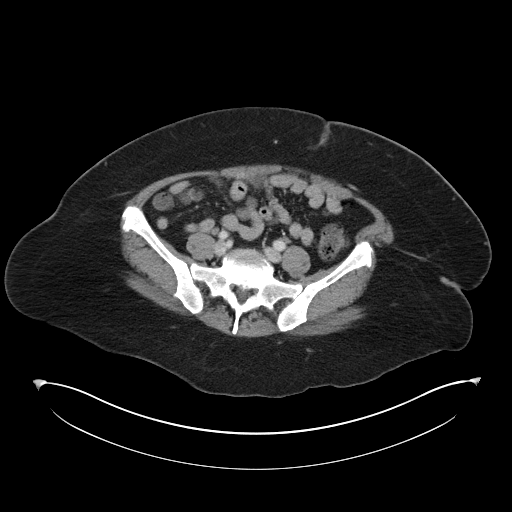
[im 39/97  soft-tissue]
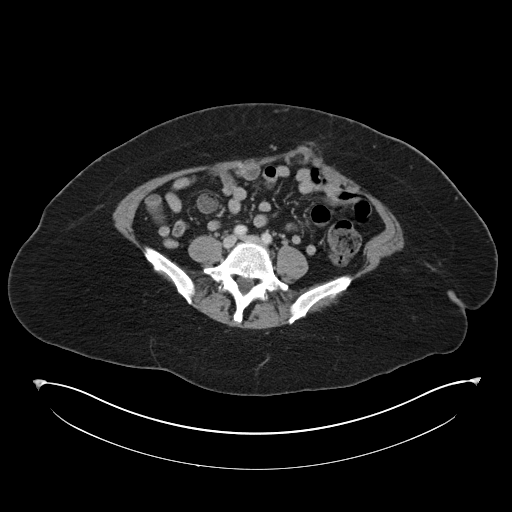
[im 44/97  soft-tissue]
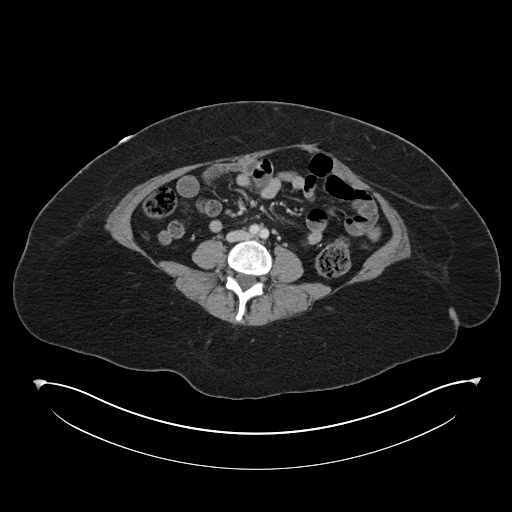
[im 53/97  soft-tissue]
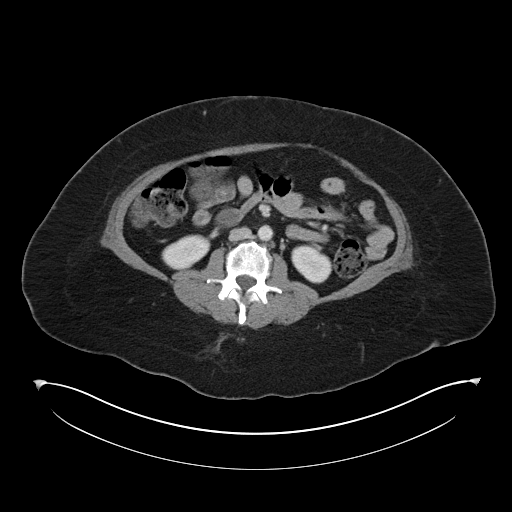
[im 58/97  soft-tissue]
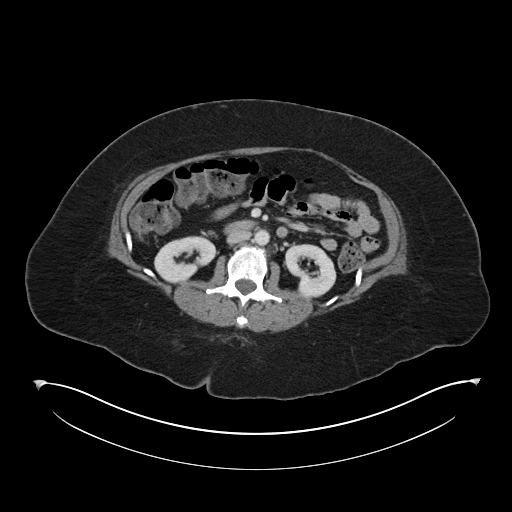
[im 58/97  bone]
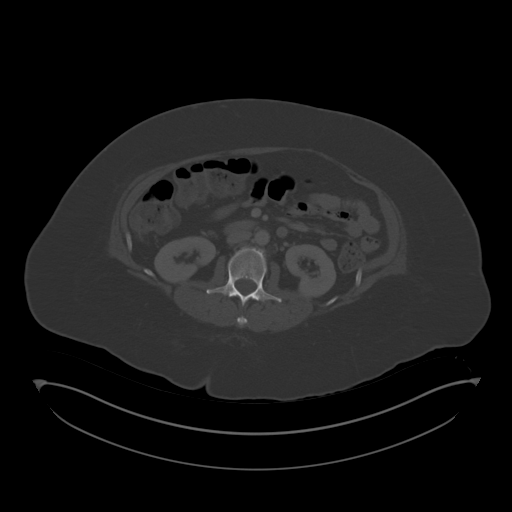
[im 63/97  soft-tissue]
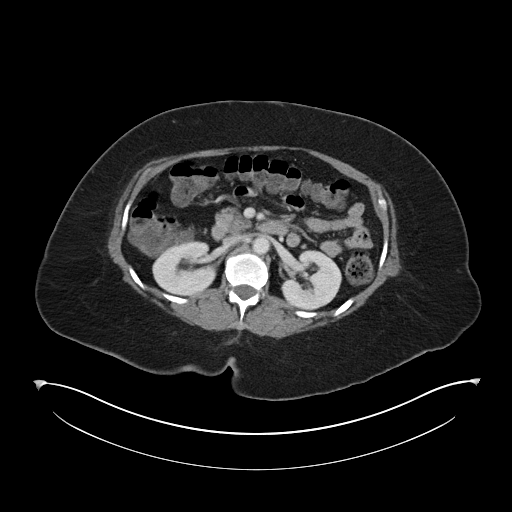
[im 73/97  soft-tissue]
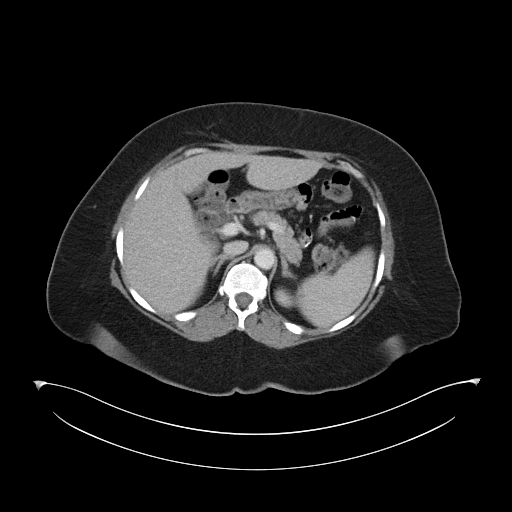
[im 77/97  soft-tissue]
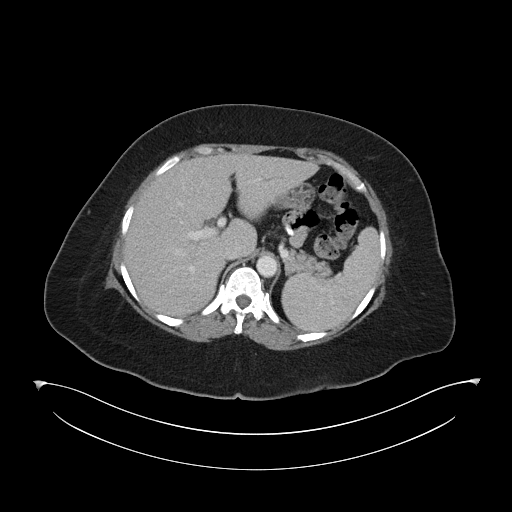
[im 82/97  soft-tissue]
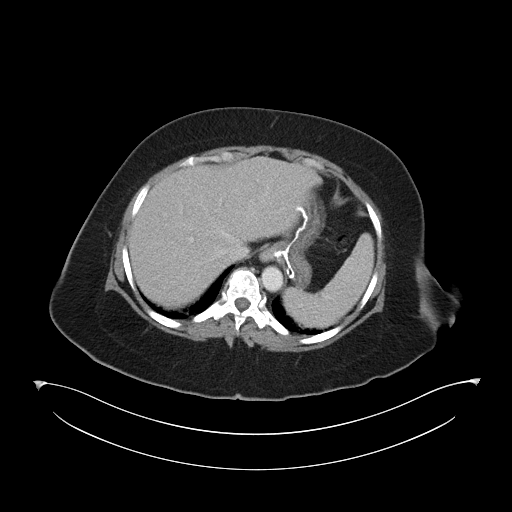
[im 92/97  soft-tissue]
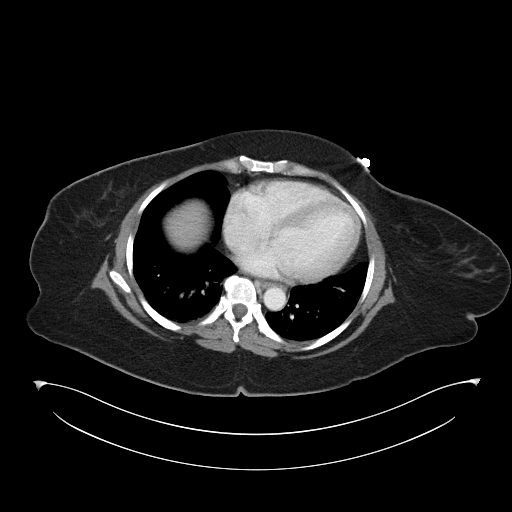

[Series 5: coronal soft tissue · coronal · 0.94mm/px · 3 of 101 slices shown]
[im 34/101  soft-tissue]
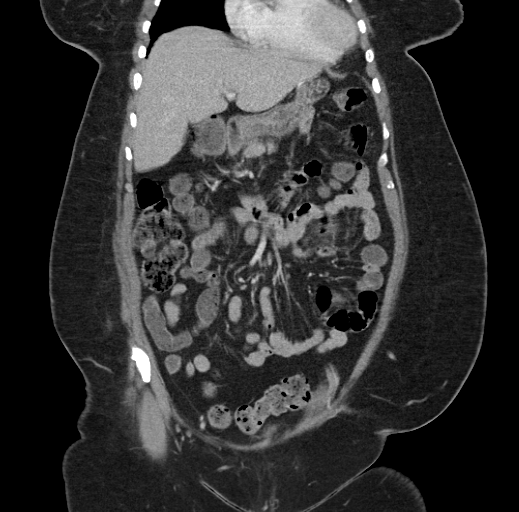
[im 45/101  soft-tissue]
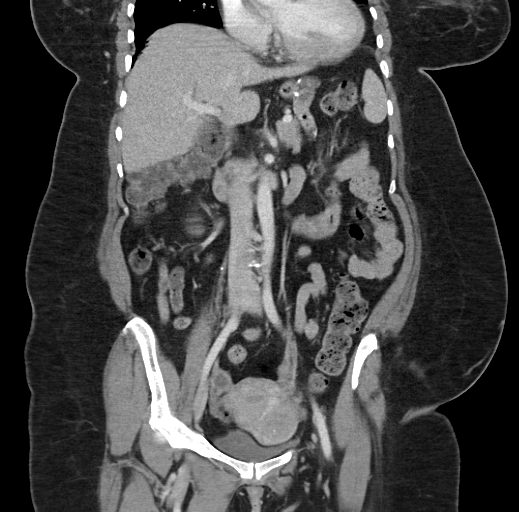
[im 56/101  soft-tissue]
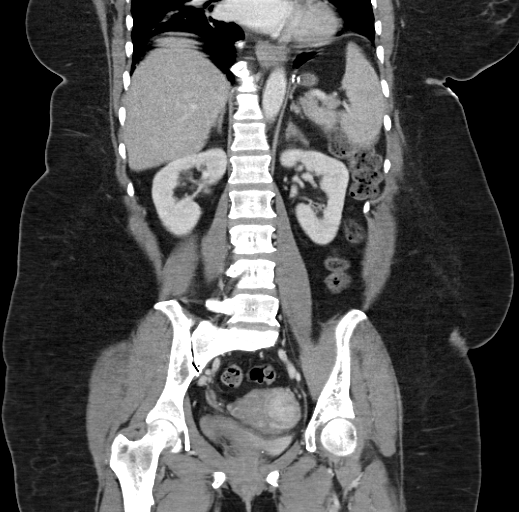

[17 of 46 positions shown; findings below may reference images not displayed]

FINDINGS: Lower chest: Dependent atelectasis in the lung bases. Small
esophageal hiatal hernia.

Hepatobiliary: No focal liver abnormality is seen. Status post
cholecystectomy. No biliary dilatation.

Pancreas: Unremarkable. No pancreatic ductal dilatation or
surrounding inflammatory changes.

Spleen: Normal in size without focal abnormality.

Adrenals/Urinary Tract: Adrenal glands are unremarkable. Kidneys are
normal, without renal calculi, focal lesion, or hydronephrosis.
Bladder is unremarkable.

Stomach/Bowel: Postoperative changes in the stomach consistent with
gastric bypass. Small bowel are decompressed. Scattered stool in the
colon. No colonic distention or inflammatory change. Appendix is not
identified.

Vascular/Lymphatic: No significant vascular findings are present. No
enlarged abdominal or pelvic lymph nodes.

Reproductive: Diffuse nodular enlargement of the uterus consistent
with fibroids. No abnormal adnexal masses.

Other: Small ventral abdominal wall hernia containing fat. No free
air or free fluid in the abdomen.

Musculoskeletal: Degenerative changes in the spine. No destructive
bone lesions.
IMPRESSION: No acute process demonstrated in the abdomen or pelvis. Uterine
fibroids. Postoperative changes. Minimal ventral abdominal wall
hernia containing fat. No evidence of bowel obstruction or
inflammation. Appendix is not visualized.

## 2016-10-22 ENCOUNTER — Ambulatory Visit (INDEPENDENT_AMBULATORY_CARE_PROVIDER_SITE_OTHER): Payer: Medicaid Other | Admitting: Physician Assistant

## 2016-10-23 ENCOUNTER — Encounter (HOSPITAL_COMMUNITY): Payer: Self-pay | Admitting: Emergency Medicine

## 2016-10-23 DIAGNOSIS — M542 Cervicalgia: Secondary | ICD-10-CM | POA: Insufficient documentation

## 2016-10-23 DIAGNOSIS — Y939 Activity, unspecified: Secondary | ICD-10-CM | POA: Diagnosis not present

## 2016-10-23 DIAGNOSIS — H538 Other visual disturbances: Secondary | ICD-10-CM | POA: Diagnosis not present

## 2016-10-23 DIAGNOSIS — Y929 Unspecified place or not applicable: Secondary | ICD-10-CM | POA: Diagnosis not present

## 2016-10-23 DIAGNOSIS — Y999 Unspecified external cause status: Secondary | ICD-10-CM | POA: Diagnosis not present

## 2016-10-23 DIAGNOSIS — I10 Essential (primary) hypertension: Secondary | ICD-10-CM | POA: Insufficient documentation

## 2016-10-23 DIAGNOSIS — W108XXA Fall (on) (from) other stairs and steps, initial encounter: Secondary | ICD-10-CM | POA: Insufficient documentation

## 2016-10-23 DIAGNOSIS — J45909 Unspecified asthma, uncomplicated: Secondary | ICD-10-CM | POA: Diagnosis not present

## 2016-10-23 DIAGNOSIS — M25552 Pain in left hip: Secondary | ICD-10-CM | POA: Diagnosis present

## 2016-10-23 DIAGNOSIS — M25551 Pain in right hip: Secondary | ICD-10-CM | POA: Diagnosis not present

## 2016-10-23 DIAGNOSIS — R110 Nausea: Secondary | ICD-10-CM | POA: Insufficient documentation

## 2016-10-23 DIAGNOSIS — R410 Disorientation, unspecified: Secondary | ICD-10-CM | POA: Diagnosis not present

## 2016-10-23 NOTE — ED Triage Notes (Signed)
Pt states she was was attempting to walk into bank this evening @1800 , tripped on curb and falling into rail, hitting forehead, R elbow and L knee. Pt denies LOC, no swelling or deformities noted. Pt requires ASL interpretor, video language line used in triage.

## 2016-10-24 ENCOUNTER — Encounter (HOSPITAL_COMMUNITY): Payer: Self-pay | Admitting: Student

## 2016-10-24 ENCOUNTER — Emergency Department (HOSPITAL_COMMUNITY): Payer: Medicare Other

## 2016-10-24 ENCOUNTER — Emergency Department (HOSPITAL_COMMUNITY)
Admission: EM | Admit: 2016-10-24 | Discharge: 2016-10-24 | Disposition: A | Discharge status: Home / Self Care | Payer: Medicare Other | Attending: Emergency Medicine | Admitting: Emergency Medicine

## 2016-10-24 DIAGNOSIS — J45909 Unspecified asthma, uncomplicated: Secondary | ICD-10-CM | POA: Diagnosis not present

## 2016-10-24 DIAGNOSIS — R52 Pain, unspecified: Secondary | ICD-10-CM

## 2016-10-24 DIAGNOSIS — M25551 Pain in right hip: Secondary | ICD-10-CM

## 2016-10-24 DIAGNOSIS — W19XXXA Unspecified fall, initial encounter: Secondary | ICD-10-CM

## 2016-10-24 MED ORDER — IBUPROFEN 800 MG PO TABS: 800.0000 mg | ORAL_TABLET | Freq: Three times a day (TID) | ORAL | 0 refills | Status: DC

## 2016-10-24 MED ORDER — CYCLOBENZAPRINE HCL 10 MG PO TABS: 10.0000 mg | ORAL_TABLET | Freq: Two times a day (BID) | ORAL | 0 refills | Status: DC | PRN

## 2016-10-24 NOTE — Progress Notes (Signed)
Orthopedic Tech Progress Note Patient Details:  Stacie Wolfe 06-17-1965 [id number]  Ortho Devices Type of Ortho Device: Crutches, Knee Immobilizer Ortho Device/Splint Location: lle Ortho Device/Splint Interventions: Ordered, Application, Adjustment   Karolee Stamps 10/24/2016, 5:13 AM

## 2016-10-24 NOTE — ED Provider Notes (Signed)
Imaging is negative. Patient reassessed by me. A professional language interpreter was used for ASL. She reports that she feels a little bit better, but still has pain in her left knee. Will give knee immobilizer and crutches. Recommend that she follow-up with orthopedics. She may also have a slight concussion, but did not lose consciousness. She has no traumatic head injury. She is stable for outpatient treatment and workup.   Montine Circle, PA-C 10/24/16 4142    Gareth Morgan, MD 10/24/16 1723

## 2016-10-24 NOTE — ED Notes (Signed)
Pt understood dc material and crutch/brace teaching. Scripts given at [employer]. Video ALS interpreter used to go over information

## 2016-10-24 NOTE — ED Provider Notes (Signed)
[facility] DEPT Provider Note   CSN: [id number] Arrival date & time: 10/23/16  2314     History   Chief Complaint Chief Complaint  Patient presents with  . Fall     Level V caveat as patient is deaf. ASL interpreter was used.  HPI Stacie Wolfe is a 51 y.o. female Presenting with a one-day history of pain after fall.  She states that she tripped when she was going up stairs, and she landed on her left knee, and bumped her head on a railing. She denies loss of consciousness or laceration. She was able to walk after the fall. When she got home, her pain was worse. She currently reports pain from her left hip to her left foot anteriorly. Pain in her forehead has gotten worse as well, and she reports neck pain. She reports feeling nauseous, confused, and having blurry vision. She denies emesis. She denies abdominal pain, chest pain, or shortness of breath. She denies pain in her right side. She reports she has not tried any Tylenol or ibuprofen, as these do not help her pain ever. She has not tried any ice or heat either.  HPI  Past Medical History:  Diagnosis Date  . Asthma   . Deaf   . Hypertension   . Migraine     There are no active problems to display for this patient.   Past Surgical History:  Procedure Laterality Date  . ABDOMINAL SURGERY    . CHOLECYSTECTOMY    . COSMETIC SURGERY    . GASTRIC BYPASS    . TUBAL LIGATION      OB History    No data available       Home Medications    Prior to Admission medications   Medication Sig Start Date End Date Taking? Authorizing Provider  albuterol (PROVENTIL HFA;VENTOLIN HFA) 108 (90 Base) MCG/ACT inhaler Inhale 2 puffs into the lungs every 4 (four) hours as needed for wheezing or shortness of breath. 12/21/15   Kirichenko, Tatyana, PA-C  albuterol (PROVENTIL) (2.5 MG/3ML) 0.083% nebulizer solution Take 3 mLs (2.5 mg total) by nebulization every 6 (six) hours as needed for wheezing or shortness of breath. 12/21/15    Kirichenko, Lahoma Rocker, PA-C  amitriptyline (ELAVIL) 25 MG tablet Take 25 mg by mouth at bedtime.    [provider]  Aspirin-Salicylamide-Caffeine (BC FAST PAIN RELIEF) 650-195-33.3 MG PACK Take 1 Package by mouth daily as needed (pain).    [provider]  naproxen (NAPROSYN) 500 MG tablet Take 1 tablet (500 mg total) by mouth 2 (two) times daily. Patient not taking: Reported on 02/02/2016 12/21/15   Jeannett Senior, PA-C  nitrofurantoin, macrocrystal-monohydrate, (MACROBID) 100 MG capsule Take 1 capsule (100 mg total) by mouth 2 (two) times daily. Patient not taking: Reported on 02/02/2016 12/21/15   Jeannett Senior, PA-C    Family History No family history on file.  Social History Social History  Substance Use Topics  . Smoking status: Never Smoker  . Smokeless tobacco: Never Used  . Alcohol use Yes     Comment: weekends      Allergies   Patient has no known allergies.   Review of Systems Review of Systems  HENT: Negative for nosebleeds, tinnitus and trouble swallowing.   Eyes: Positive for visual disturbance (blurry). Negative for pain.  Respiratory: Negative for shortness of breath.   Cardiovascular: Negative for chest pain.  Gastrointestinal: Positive for nausea. Negative for abdominal pain and vomiting.  Musculoskeletal: Positive for arthralgias, gait problem, myalgias  and neck pain. Negative for neck stiffness.  Skin: Negative for rash and wound.  Neurological: Positive for headaches. Negative for dizziness.  Psychiatric/Behavioral: Positive for confusion.     Physical Exam Updated Vital Signs BP (!) 134/117 (BP Location: Right Arm)   Pulse (!) 105   Temp 98.3 F (36.8 C) (Oral)   Resp 16   Ht 5\' 3"  (1.6 m)   Wt 90.7 kg (200 lb)   SpO2 100%   BMI 35.43 kg/m   Physical Exam  Constitutional: She is oriented to person, place, and time. She appears well-developed and well-nourished. No distress.  HENT:  Head: Normocephalic. Head is  without contusion and without laceration.  Right Ear: No drainage.  Left Ear: No drainage.  Nose: Nose normal.  Mouth/Throat: Uvula is midline, oropharynx is clear and moist and mucous membranes are normal.  No evidence of laceration or hematoma of her head. Tenderness to palpation of entire frontal area. No tenderness to palpation of the rest of her face.  Eyes: Conjunctivae, EOM and lids are normal. Pupils are equal, round, and reactive to light.  Neck:  TTP of entire neck, no increased tenderness over bony protrusions.   Cardiovascular: Normal rate, regular rhythm, normal heart sounds and intact distal pulses.   Pulmonary/Chest: Effort normal and breath sounds normal.  Abdominal: Soft. There is no tenderness.  Musculoskeletal:       Right hip: Normal.       Right knee: Normal.       Left knee: Tenderness found.  Tenderness of left knee, mostly anterior. No laceration or obvious swelling. Tenderness of lateral hip. Patient stated she could not do history leg raise due to pain. Sensation intact bilaterally, pulses equal bilaterally, color and temperature equal bilaterally. Upper extremity strength and sensation intact bilaterally. Lower extremity strength decreased as patient unwilling to do range of motion test due to pain.  Neurological: She is alert and oriented to person, place, and time. No cranial nerve deficit or sensory deficit.  Skin: Skin is warm and dry. No laceration noted. She is not diaphoretic.     ED Treatments / Results  Labs (all labs ordered are listed, but only abnormal results are displayed) Labs Reviewed - No data to display  EKG  EKG Interpretation None       Radiology Ct Head Wo Contrast  Result Date: 10/24/2016 CLINICAL DATA:  Tripped on curb and fell into rail, hitting forehead. Concern for cervical spine injury. Initial encounter. EXAM: CT HEAD WITHOUT CONTRAST CT CERVICAL SPINE WITHOUT CONTRAST TECHNIQUE: Multidetector CT imaging of the head and  cervical spine was performed following the standard protocol without intravenous contrast. Multiplanar CT image reconstructions of the cervical spine were also generated. COMPARISON:  None. FINDINGS: CT HEAD FINDINGS Brain: No evidence of acute infarction, hemorrhage, hydrocephalus, extra-axial collection or mass lesion/mass effect. Prominence of the sulci suggests mild cortical volume loss. Mild periventricular and subcortical white matter change likely reflects small vessel ischemic microangiopathy. The brainstem and fourth ventricle are within normal limits. The basal ganglia are unremarkable in appearance. The cerebral hemispheres demonstrate grossly normal gray-white differentiation. No mass effect or midline shift is seen. Vascular: No hyperdense vessel or unexpected calcification. Skull: There is no evidence of fracture; visualized osseous structures are unremarkable in appearance. Sinuses/Orbits: The visualized portions of the orbits are within normal limits. The paranasal sinuses and mastoid air cells are well-aerated. Other: No significant soft tissue abnormalities are seen. CT CERVICAL SPINE FINDINGS Alignment: Normal. Skull base and vertebrae:  No acute fracture. No primary bone lesion or focal pathologic process. Soft tissues and spinal canal: No prevertebral fluid or swelling. No visible canal hematoma. Disc levels: There is minimal disc space narrowing at C3-C4. Prominent anterior and posterior disc osteophyte complexes are seen along the upper cervical spine. Upper chest: The visualized lung apices are clear. The thyroid gland is unremarkable. Other: No additional soft tissue abnormalities are seen. IMPRESSION: 1. No evidence of traumatic intracranial injury or fracture. 2. No evidence of fracture or subluxation along the cervical spine. 3. Mild cortical volume loss and scattered small vessel ischemic microangiopathy. 4. Mild degenerative change along the upper cervical spine. Electronically Signed    By: Garald Balding M.D.   On: 10/24/2016 02:46   Ct Cervical Spine Wo Contrast  Result Date: 10/24/2016 CLINICAL DATA:  Tripped on curb and fell into rail, hitting forehead. Concern for cervical spine injury. Initial encounter. EXAM: CT HEAD WITHOUT CONTRAST CT CERVICAL SPINE WITHOUT CONTRAST TECHNIQUE: Multidetector CT imaging of the head and cervical spine was performed following the standard protocol without intravenous contrast. Multiplanar CT image reconstructions of the cervical spine were also generated. COMPARISON:  None. FINDINGS: CT HEAD FINDINGS Brain: No evidence of acute infarction, hemorrhage, hydrocephalus, extra-axial collection or mass lesion/mass effect. Prominence of the sulci suggests mild cortical volume loss. Mild periventricular and subcortical white matter change likely reflects small vessel ischemic microangiopathy. The brainstem and fourth ventricle are within normal limits. The basal ganglia are unremarkable in appearance. The cerebral hemispheres demonstrate grossly normal gray-white differentiation. No mass effect or midline shift is seen. Vascular: No hyperdense vessel or unexpected calcification. Skull: There is no evidence of fracture; visualized osseous structures are unremarkable in appearance. Sinuses/Orbits: The visualized portions of the orbits are within normal limits. The paranasal sinuses and mastoid air cells are well-aerated. Other: No significant soft tissue abnormalities are seen. CT CERVICAL SPINE FINDINGS Alignment: Normal. Skull base and vertebrae: No acute fracture. No primary bone lesion or focal pathologic process. Soft tissues and spinal canal: No prevertebral fluid or swelling. No visible canal hematoma. Disc levels: There is minimal disc space narrowing at C3-C4. Prominent anterior and posterior disc osteophyte complexes are seen along the upper cervical spine. Upper chest: The visualized lung apices are clear. The thyroid gland is unremarkable. Other: No  additional soft tissue abnormalities are seen. IMPRESSION: 1. No evidence of traumatic intracranial injury or fracture. 2. No evidence of fracture or subluxation along the cervical spine. 3. Mild cortical volume loss and scattered small vessel ischemic microangiopathy. 4. Mild degenerative change along the upper cervical spine. Electronically Signed   By: Garald Balding M.D.   On: 10/24/2016 02:46    Procedures Procedures (including critical care time)  Medications Ordered in ED Medications - No data to display   Initial Impression / Assessment and Plan / ED Course  I have reviewed the triage vital signs and the nursing notes.  Pertinent labs & imaging results that were available during my care of the patient were reviewed by me and considered in my medical decision making (see chart for details).     Patient with history of deafness and likely MR. Due to inability to know what baseline is , and her reports of vision changes, confusion, nausea, I will order head CT. CT cervical due to her new onset neck pain after fall. Will order x-ray for left hip and knee pain. If imaging is negative, will consider treatment for musculoskeletal pain with high-dose NSAIDs and muscle relaxants.  Patient signed out to [employer].  Final Clinical Impressions(s) / ED Diagnoses   Final diagnoses:  Right hip pain    New Prescriptions New Prescriptions   No medications on file     Franchot Heidelberg, PA-C 10/24/16 0255    Gareth Morgan, MD 10/24/16 1722

## 2016-11-07 ENCOUNTER — Ambulatory Visit (INDEPENDENT_AMBULATORY_CARE_PROVIDER_SITE_OTHER): Payer: Medicare Other | Admitting: Physician Assistant

## 2016-11-07 ENCOUNTER — Encounter (INDEPENDENT_AMBULATORY_CARE_PROVIDER_SITE_OTHER): Payer: Self-pay | Admitting: Physician Assistant

## 2016-11-07 VITALS — BP 143/91 | HR 91 | Temp 98.0°F | Ht 61.0 in | Wt 207.8 lb

## 2016-11-07 DIAGNOSIS — Z1231 Encounter for screening mammogram for malignant neoplasm of breast: Secondary | ICD-10-CM | POA: Diagnosis not present

## 2016-11-07 DIAGNOSIS — I10 Essential (primary) hypertension: Secondary | ICD-10-CM | POA: Diagnosis not present

## 2016-11-07 DIAGNOSIS — J45998 Other asthma: Secondary | ICD-10-CM | POA: Diagnosis not present

## 2016-11-07 DIAGNOSIS — R300 Dysuria: Secondary | ICD-10-CM

## 2016-11-07 DIAGNOSIS — L299 Pruritus, unspecified: Secondary | ICD-10-CM | POA: Diagnosis not present

## 2016-11-07 DIAGNOSIS — J45909 Unspecified asthma, uncomplicated: Secondary | ICD-10-CM

## 2016-11-07 LAB — POCT URINALYSIS DIPSTICK
BILIRUBIN UA: NEGATIVE
Blood, UA: NEGATIVE
Glucose, UA: NEGATIVE
Leukocytes, UA: NEGATIVE
Nitrite, UA: NEGATIVE
PH UA: 6 (ref 5.0–8.0)
SPEC GRAV UA: 1.02 (ref 1.010–1.025)
Urobilinogen, UA: 0.2 E.U./dL

## 2016-11-07 MED ORDER — ALBUTEROL SULFATE HFA 108 (90 BASE) MCG/ACT IN AERS: 2.0000 | INHALATION_SPRAY | RESPIRATORY_TRACT | 0 refills | Status: DC | PRN

## 2016-11-07 MED ORDER — HYDROXYZINE HCL 25 MG PO TABS: 25.0000 mg | ORAL_TABLET | Freq: Two times a day (BID) | ORAL | 0 refills | Status: DC | PRN

## 2016-11-07 MED ORDER — ALBUTEROL SULFATE (2.5 MG/3ML) 0.083% IN NEBU: 2.5000 mg | INHALATION_SOLUTION | Freq: Four times a day (QID) | RESPIRATORY_TRACT | 12 refills | Status: DC | PRN

## 2016-11-07 MED ORDER — CLONIDINE HCL 0.1 MG PO TABS
0.1000 mg | ORAL_TABLET | Freq: Once | ORAL | Status: AC
  Administered 2016-11-07: 0.1 mg via ORAL

## 2016-11-07 MED ORDER — HYDROCHLOROTHIAZIDE 25 MG PO TABS: 25.0000 mg | ORAL_TABLET | Freq: Every day | ORAL | 1 refills | Status: DC

## 2016-11-07 MED ORDER — ALBUTEROL SULFATE HFA 108 (90 BASE) MCG/ACT IN AERS: 2.0000 | INHALATION_SPRAY | Freq: Four times a day (QID) | RESPIRATORY_TRACT | 11 refills | Status: DC | PRN

## 2016-11-07 NOTE — Progress Notes (Signed)
Subjective:  Patient ID: Stacie Wolfe, female    DOB: 05/12/1966  Age: 51 y.o. MRN: [id number]  CC:   HPI Stacie Wolfe is a 51 y.o. female with a PMH of deafness, HTN, Asthma, Migraines, and gastric bypass surgery presents with itching of the body without rash. Endorses heat intolerance, depression, insomnia, irregular menstrual period, diarrhea, hair loss, and mild weakness. Some chest tightness possibly attributed to asthma. Denies SOB, palpitations, abdominal pain, or rash.    States she would like to go to Gyn due to a genital burning sensation and dysuria.   *Sign language interpreter present during encounter     Outpatient Medications Prior to Visit  Medication Sig Dispense Refill  . amitriptyline (ELAVIL) 25 MG tablet Take 25 mg by mouth at bedtime.    . cyclobenzaprine (FLEXERIL) 10 MG tablet Take 1 tablet (10 mg total) by mouth 2 (two) times daily as needed for muscle spasms. 20 tablet 0  . ibuprofen (ADVIL,MOTRIN) 800 MG tablet Take 1 tablet (800 mg total) by mouth 3 (three) times daily. 21 tablet 0  . albuterol (PROVENTIL HFA;VENTOLIN HFA) 108 (90 Base) MCG/ACT inhaler Inhale 2 puffs into the lungs every 4 (four) hours as needed for wheezing or shortness of breath. 1 Inhaler 0  . albuterol (PROVENTIL) (2.5 MG/3ML) 0.083% nebulizer solution Take 3 mLs (2.5 mg total) by nebulization every 6 (six) hours as needed for wheezing or shortness of breath. 75 mL 12  . Aspirin-Salicylamide-Caffeine (BC FAST PAIN RELIEF) 650-195-33.3 MG PACK Take 1 Package by mouth daily as needed (pain).    . naproxen (NAPROSYN) 500 MG tablet Take 1 tablet (500 mg total) by mouth 2 (two) times daily. (Patient not taking: Reported on 02/02/2016) 30 tablet 0  . nitrofurantoin, macrocrystal-monohydrate, (MACROBID) 100 MG capsule Take 1 capsule (100 mg total) by mouth 2 (two) times daily. (Patient not taking: Reported on 02/02/2016) 10 capsule 0   No facility-administered medications prior to visit.       ROS Review of Systems  Constitutional: Negative for chills, fever and malaise/fatigue.  Eyes: Negative for blurred vision.  Respiratory: Negative for shortness of breath.   Cardiovascular: Negative for chest pain and palpitations.  Gastrointestinal: Positive for diarrhea. Negative for abdominal pain and nausea.  Genitourinary: Positive for dysuria. Negative for hematuria.       Irregular menstrual period  Musculoskeletal: Negative for joint pain and myalgias.  Skin: Positive for itching. Negative for rash.       Hair loss  Neurological: Negative for tingling and headaches.       Generalized weakness  Endo/Heme/Allergies:       Heat intolerance  Psychiatric/Behavioral: Positive for depression. The patient has insomnia. The patient is not nervous/anxious.     Objective:  BP (!) 143/91   Pulse 91   Temp 98 F (36.7 C) (Oral)   Ht 5\' 1"  (1.549 m)   Wt 207 lb 12.8 oz (94.3 kg)   SpO2 94%   BMI 39.26 kg/m   BP/Weight 11/07/2016 10/24/2016 9/52/8413  Systolic BP 244 - 010  Diastolic BP 91 - 272  Wt. (Lbs) 207.8 - 200  BMI 39.26 35.43 -      Physical Exam  Constitutional: She is oriented to person, place, and time.  Well developed, obese, NAD, deaf  HENT:  Head: Normocephalic and atraumatic.  Eyes: No scleral icterus.  Neck: Normal range of motion. Neck supple. No thyromegaly present.  Cardiovascular: Normal rate, regular rhythm and normal heart sounds.   Pulmonary/Chest: Effort  normal and breath sounds normal.  Abdominal: Soft. Bowel sounds are normal. She exhibits no distension and no mass. There is no tenderness. There is no rebound and no guarding.  Musculoskeletal: She exhibits no edema.  Neurological: She is alert and oriented to person, place, and time. No cranial nerve deficit. Coordination normal.  Skin: Skin is warm and dry. No rash noted. No erythema. No pallor.  Psychiatric: She has a normal mood and affect. Her behavior is normal. Thought content normal.   Vitals reviewed.    Assessment & Plan:   1. Hypertension, unspecified type - Administered cloNIDine (CATAPRES) tablet 0.1 mg; Take 1 tablet (0.1 mg total) by mouth once. - Comprehensive metabolic panel - CBC with Differential - Thyroid Panel With TSH - Begin hydrochlorothiazide (HYDRODIURIL) 25 MG tablet; Take 1 tablet (25 mg total) by mouth daily. Take on tablet in the morning.  Dispense: 90 tablet; Refill: 1  2. Itching - Begin hydrOXYzine (ATARAX/VISTARIL) 25 MG tablet; Take 1 tablet (25 mg total) by mouth 2 (two) times daily as needed.  Dispense: 30 tablet; Refill: 0  3. Severe asthma, unspecified whether complicated, unspecified whether persistent - Refill albuterol (PROVENTIL) (2.5 MG/3ML) 0.083% nebulizer solution; Take 3 mLs (2.5 mg total) by nebulization every 6 (six) hours as needed for wheezing or shortness of breath.  Dispense: 75 mL; Refill: 12  4. Visit for screening mammogram - MS DIGITAL SCREENING BILATERAL; Future  5. Dysuria - Urinalysis Dipstick unremarkable in clinic today - Ambulatory referral to Gynecology per pt request.   Meds ordered this encounter  Medications  . cloNIDine (CATAPRES) tablet 0.1 mg  . DISCONTD: albuterol (PROVENTIL HFA;VENTOLIN HFA) 108 (90 Base) MCG/ACT inhaler    Sig: Inhale 2 puffs into the lungs every 4 (four) hours as needed for wheezing or shortness of breath.    Dispense:  1 Inhaler    Refill:  0    Order Specific Question:   Supervising Provider    Answer:   Tresa Garter [id number]  . DISCONTD: albuterol (PROVENTIL) (2.5 MG/3ML) 0.083% nebulizer solution    Sig: Take 3 mLs (2.5 mg total) by nebulization every 6 (six) hours as needed for wheezing or shortness of breath.    Dispense:  75 mL    Refill:  12    Order Specific Question:   Supervising Provider    Answer:   Tresa Garter [id number]  . hydrochlorothiazide (HYDRODIURIL) 25 MG tablet    Sig: Take 1 tablet (25 mg total) by mouth daily. Take on tablet in  the morning.    Dispense:  90 tablet    Refill:  1    Order Specific Question:   Supervising Provider    Answer:   Tresa Garter [id number]  . hydrOXYzine (ATARAX/VISTARIL) 25 MG tablet    Sig: Take 1 tablet (25 mg total) by mouth 2 (two) times daily as needed.    Dispense:  30 tablet    Refill:  0    Order Specific Question:   Supervising Provider    Answer:   Tresa Garter [id number]  . albuterol (PROVENTIL) (2.5 MG/3ML) 0.083% nebulizer solution    Sig: Take 3 mLs (2.5 mg total) by nebulization every 6 (six) hours as needed for wheezing or shortness of breath.    Dispense:  75 mL    Refill:  12    Order Specific Question:   Supervising Provider    Answer:   Tresa Garter [id number]  . albuterol (PROVENTIL HFA;VENTOLIN  HFA) 108 (90 Base) MCG/ACT inhaler    Sig: Inhale 2 puffs into the lungs every 6 (six) hours as needed for wheezing or shortness of breath.    Dispense:  1 Inhaler    Refill:  11    Order Specific Question:   Supervising Provider    Answer:   Tresa Garter [id number]    Follow-up: Return in about 2 weeks (around 11/21/2016) for HTN and joint pain.   Clent Demark PA

## 2016-11-07 NOTE — Patient Instructions (Signed)
Asthma Attack Prevention, Adult Although you may not be able to control the fact that you have asthma, you can take actions to prevent episodes of asthma (asthma attacks). These actions include:  Creating a written plan for managing and treating your asthma attacks (asthma action plan).  Monitoring your asthma.  Avoiding things that can irritate your airways or make your asthma symptoms worse (asthma triggers).  Taking your medicines as directed.  Acting quickly if you have signs or symptoms of an asthma attack.  What are some ways to prevent an asthma attack? Create a plan Work with your health care provider to create an asthma action plan. This plan should include:  A list of your asthma triggers and how to avoid them.  A list of symptoms that you experience during an asthma attack.  Information about when to take medicine and how much medicine to take.  Information to help you understand your peak flow measurements.  Contact information for your health care providers.  Daily actions that you can take to control asthma.  Monitor your asthma  To monitor your asthma:  Use your peak flow meter every morning and every evening for 2-3 weeks. Record the results in a journal. A drop in your peak flow numbers on one or more days may mean that you are starting to have an asthma attack, even if you are not having symptoms.  When you have asthma symptoms, write them down in a journal.  Avoid asthma triggers  Work with your health care provider to find out what your asthma triggers are. This can be done by:  Being tested for allergies.  Keeping a journal that notes when asthma attacks occur and what may have contributed to them.  Asking your health care provider whether other medical conditions make your asthma worse.  Common asthma triggers include:  Dust.  Smoke. This includes campfire smoke and secondhand smoke from tobacco products.  Pet dander.  Trees, grasses or  pollens.  Very cold, dry, or humid air.  Mold.  Foods that contain high amounts of sulfites.  Strong smells.  Engine exhaust and air pollution.  Aerosol sprays and fumes from household cleaners.  Household pests and their droppings, including dust mites and cockroaches.  Certain medicines, including NSAIDs.  Once you have determined your asthma triggers, take steps to avoid them. Depending on your triggers, you may be able to reduce the chance of an asthma attack by:  Keeping your home clean. Have someone dust and vacuum your home for you 1 or 2 times a week. If possible, have them use a high-efficiency particulate arrestance (HEPA) vacuum.  Washing your sheets weekly in hot water.  Using allergy-proof mattress covers and casings on your bed.  Keeping pets out of your home.  Taking care of mold and water problems in your home.  Avoiding areas where people smoke.  Avoiding using strong perfumes or odor sprays.  Avoid spending a lot of time outdoors when pollen counts are high and on very windy days.  Talking with your health care provider before stopping or starting any new medicines.  Medicines Take over-the-counter and prescription medicines only as told by your health care provider. Many asthma attacks can be prevented by carefully following your medicine schedule. Taking your medicines correctly is especially important when you cannot avoid certain asthma triggers. Even if you are doing well, do not stop taking your medicine and do not take less medicine. Act quickly If an asthma attack happens, acting quickly   it is and how long it lasts. Take these actions:  Pay attention to your symptoms. If you are coughing, wheezing, or having difficulty breathing, do not wait to see if your symptoms go away on their own. Follow your asthma action plan.  If you have followed your asthma action plan and your symptoms are not improving, call your health care  provider or seek immediate medical care at the nearest hospital. It is important to write down how often you need to use your fast-acting rescue inhaler. You can track how often you use an inhaler in your journal. If you are using your rescue inhaler more often, it may mean that your asthma is not under control. Adjusting your asthma treatment plan may help you to prevent future asthma attacks and help you to gain better control of your condition. How can I prevent an asthma attack when I exercise?   Exercise is a common asthma trigger. To prevent asthma attacks during exercise:  Follow advice from your health care provider about whether you should use your fast-acting inhaler before exercising. Many people with asthma experience exercise-induced bronchoconstriction (EIB). This condition often worsens during vigorous exercise in cold, humid, or dry environments. Usually, people with EIB can stay very active by using a fast-acting inhaler before exercising.  Avoid exercising outdoors in very cold or humid weather.  Avoid exercising outdoors when pollen counts are high.  Warm up and cool down when exercising.  Stop exercising right away if asthma symptoms start. Consider taking part in exercises that are less likely to cause asthma symptoms such as:  Indoor swimming.  Biking.  Walking.  Hiking.  Playing football. This information is not intended to replace advice given to you by your health care provider. Make sure you discuss any questions you have with your health care provider. Document Released: 04/17/2009 Document Revised: 12/29/2015 Document Reviewed: 10/14/2015 [employer] Interactive Patient Education  2017 [employer]  

## 2016-11-08 LAB — CBC WITH DIFFERENTIAL/PLATELET
BASOS ABS: 0 10*3/uL (ref 0.0–0.2)
Basos: 1 %
EOS (ABSOLUTE): 0.1 10*3/uL (ref 0.0–0.4)
Eos: 2 %
Hematocrit: 28.2 % — ABNORMAL LOW (ref 34.0–46.6)
Hemoglobin: 7.4 g/dL — ABNORMAL LOW (ref 11.1–15.9)
Immature Grans (Abs): 0 10*3/uL (ref 0.0–0.1)
Immature Granulocytes: 0 %
LYMPHS ABS: 1.5 10*3/uL (ref 0.7–3.1)
Lymphs: 22 %
MCH: 15.3 pg — AB (ref 26.6–33.0)
MCHC: 26.2 g/dL — AB (ref 31.5–35.7)
MCV: 58 fL — ABNORMAL LOW (ref 79–97)
Monocytes Absolute: 0.6 10*3/uL (ref 0.1–0.9)
Monocytes: 8 %
NEUTROS ABS: 4.8 10*3/uL (ref 1.4–7.0)
Neutrophils: 67 %
PLATELETS: 447 10*3/uL — AB (ref 150–379)
RBC: 4.84 x10E6/uL (ref 3.77–5.28)
RDW: 21.4 % — ABNORMAL HIGH (ref 12.3–15.4)
WBC: 7 10*3/uL (ref 3.4–10.8)

## 2016-11-08 LAB — COMPREHENSIVE METABOLIC PANEL
ALK PHOS: 130 IU/L — AB (ref 39–117)
ALT: 12 IU/L (ref 0–32)
AST: 20 IU/L (ref 0–40)
Albumin/Globulin Ratio: 1.5 (ref 1.2–2.2)
Albumin: 4 g/dL (ref 3.5–5.5)
BUN / CREAT RATIO: 22 (ref 9–23)
BUN: 12 mg/dL (ref 6–24)
Bilirubin Total: 0.5 mg/dL (ref 0.0–1.2)
CALCIUM: 10.4 mg/dL — AB (ref 8.7–10.2)
CO2: 20 mmol/L (ref 20–29)
Chloride: 103 mmol/L (ref 96–106)
Creatinine, Ser: 0.55 mg/dL — ABNORMAL LOW (ref 0.57–1.00)
GFR calc Af Amer: 126 mL/min/{1.73_m2} (ref >59)
GFR calc non Af Amer: 109 mL/min/{1.73_m2} (ref >59)
GLOBULIN, TOTAL: 2.7 g/dL (ref 1.5–4.5)
Glucose: 98 mg/dL (ref 65–99)
POTASSIUM: 4.3 mmol/L (ref 3.5–5.2)
Sodium: 137 mmol/L (ref 134–144)
Total Protein: 6.7 g/dL (ref 6.0–8.5)

## 2016-11-08 LAB — THYROID PANEL WITH TSH
Free Thyroxine Index: 1.6 (ref 1.2–4.9)
T3 Uptake Ratio: 26 % (ref 24–39)
T4, Total: 6.2 ug/dL (ref 4.5–12.0)
TSH: 1.45 u[IU]/mL (ref 0.450–4.500)

## 2016-11-11 ENCOUNTER — Encounter (INDEPENDENT_AMBULATORY_CARE_PROVIDER_SITE_OTHER): Payer: Self-pay

## 2016-11-26 ENCOUNTER — Encounter: Payer: Self-pay | Admitting: Obstetrics & Gynecology

## 2016-12-26 ENCOUNTER — Encounter: Payer: Medicaid Other | Admitting: Obstetrics & Gynecology

## 2016-12-29 ENCOUNTER — Emergency Department (HOSPITAL_COMMUNITY)
Admission: EM | Admit: 2016-12-29 | Discharge: 2016-12-29 | Disposition: A | Discharge status: Home / Self Care | Payer: Medicare Other | Attending: Emergency Medicine | Admitting: Emergency Medicine

## 2016-12-29 ENCOUNTER — Encounter (HOSPITAL_COMMUNITY): Payer: Self-pay

## 2016-12-29 DIAGNOSIS — I10 Essential (primary) hypertension: Secondary | ICD-10-CM | POA: Insufficient documentation

## 2016-12-29 DIAGNOSIS — F10129 Alcohol abuse with intoxication, unspecified: Secondary | ICD-10-CM | POA: Insufficient documentation

## 2016-12-29 DIAGNOSIS — T43011A Poisoning by tricyclic antidepressants, accidental (unintentional), initial encounter: Secondary | ICD-10-CM | POA: Diagnosis present

## 2016-12-29 DIAGNOSIS — Z8673 Personal history of transient ischemic attack (TIA), and cerebral infarction without residual deficits: Secondary | ICD-10-CM | POA: Insufficient documentation

## 2016-12-29 DIAGNOSIS — F10929 Alcohol use, unspecified with intoxication, unspecified: Secondary | ICD-10-CM

## 2016-12-29 DIAGNOSIS — R402411 Glasgow coma scale score 13-15, in the field [EMT or ambulance]: Secondary | ICD-10-CM | POA: Diagnosis not present

## 2016-12-29 HISTORY — DX: Depression, unspecified: F32.A

## 2016-12-29 HISTORY — DX: Major depressive disorder, single episode, unspecified: F32.9

## 2016-12-29 HISTORY — DX: Cerebral infarction, unspecified: I63.9

## 2016-12-29 LAB — I-STAT CHEM 8, ED
BUN: <3 mg/dL — ABNORMAL LOW (ref 6–20)
CALCIUM ION: 1.37 mmol/L (ref 1.15–1.40)
CHLORIDE: 105 mmol/L (ref 101–111)
Creatinine, Ser: 0.6 mg/dL (ref 0.44–1.00)
Glucose, Bld: 142 mg/dL — ABNORMAL HIGH (ref 65–99)
HCT: 27 % — ABNORMAL LOW (ref 36.0–46.0)
HEMOGLOBIN: 9.2 g/dL — AB (ref 12.0–15.0)
Potassium: 3.4 mmol/L — ABNORMAL LOW (ref 3.5–5.1)
SODIUM: 139 mmol/L (ref 135–145)
TCO2: 23 mmol/L (ref 0–100)

## 2016-12-29 LAB — CBG MONITORING, ED: GLUCOSE-CAPILLARY: 143 mg/dL — AB (ref 65–99)

## 2016-12-29 LAB — RAPID URINE DRUG SCREEN, HOSP PERFORMED
Amphetamines: NOT DETECTED
BARBITURATES: NOT DETECTED
Benzodiazepines: NOT DETECTED
Cocaine: NOT DETECTED
Opiates: NOT DETECTED
Tetrahydrocannabinol: NOT DETECTED

## 2016-12-29 LAB — ETHANOL: ALCOHOL ETHYL (B): 56 mg/dL — AB (ref <5)

## 2016-12-29 MED ORDER — LORAZEPAM 2 MG/ML IJ SOLN
2.0000 mg | Freq: Once | INTRAMUSCULAR | Status: AC
  Administered 2016-12-29: 2 mg via INTRAMUSCULAR
  Filled 2016-12-29: qty 1

## 2016-12-29 NOTE — ED Provider Notes (Signed)
[facility] DEPT Provider Note   CSN: [id number] Arrival date & time: 12/29/16  0205     History   Chief Complaint Chief Complaint  Patient presents with  . Ingestion    HPI Stacie Wolfe is a 51 y.o. female.  51 year old female with a history of depression, hypertension, and stroke presents to the emergency department for acute intoxication. Daughter states the patient drank 32 ounces of a hurricane which contained 10% alcohol. There was questionable ingestion of amitriptyline, though police counted the patient's tablets and denied any missing medication. The patient was seen taking a half a tablet of ibuprofen prior to arrival. Daughter denies any recent suicidality or worsening depression. She states that her mother usually has one drink prior to bed to help her sleep, but today seemed different. Patient denies any complaints of pain. Patient is deaf. Daughter is translating with sign language at bedside.   The history is provided by the patient. No language interpreter was used.  Ingestion     Past Medical History:  Diagnosis Date  . Depression   . Hypertension   . Stroke [facility])     There are no active problems to display for this patient.   History reviewed. No pertinent surgical history.  OB History    No data available       Home Medications    Prior to Admission medications   Medication Sig Start Date End Date Taking? Authorizing Provider  albuterol (PROVENTIL) (2.5 MG/3ML) 0.083% nebulizer solution Inhale 2.5 mg into the lungs every 6 (six) hours as needed for wheezing or shortness of breath.  11/28/16  Yes [provider]  amitriptyline (ELAVIL) 25 MG tablet Take 25 mg by mouth at bedtime as needed for sleep.  09/24/16  Yes [provider]  cyclobenzaprine (FLEXERIL) 10 MG tablet Take 10 mg by mouth 2 (two) times daily as needed for muscle spasms.  10/24/16  Yes [provider]  hydrochlorothiazide (HYDRODIURIL) 25 MG tablet Take 25  mg by mouth daily after breakfast. 11/07/16  Yes [provider]  hydrOXYzine (ATARAX/VISTARIL) 25 MG tablet Take 25 mg by mouth 2 (two) times daily as needed. 11/07/16  Yes [provider]  ibuprofen (ADVIL,MOTRIN) 800 MG tablet Take 800 mg by mouth every 8 (eight) hours as needed for headache, mild pain or moderate pain.  10/24/16  Yes [provider]  PROAIR HFA 108 (90 Base) MCG/ACT inhaler Inhale 2 puffs into the lungs 4 (four) times daily as needed for wheezing or shortness of breath.  12/20/16  Yes [provider]  SUMAtriptan (IMITREX) 100 MG tablet Take 100 mg by mouth every 2 (two) hours as needed for migraine or headache.  11/08/16  Yes [provider]    Family History History reviewed. No pertinent family history.  Social History Social History  Substance Use Topics  . Smoking status: Never Smoker  . Smokeless tobacco: Never Used  . Alcohol use Yes     Allergies   Patient has no known allergies.   Review of Systems Review of Systems  Unable to perform ROS: Other  Difficult to communicated as patient is both deaf and intoxicated.   Physical Exam Updated Vital Signs BP 109/63   Pulse 86   Resp 16   Ht 5\' 6"  (1.676 m)   Wt 83.9 kg (185 lb)   SpO2 99%   BMI 29.86 kg/m   Physical Exam  Constitutional: She appears well-developed and well-nourished. No distress.  Anxious and mild agitation.  Redirectable. In NAD.  HENT:  Head: Normocephalic and atraumatic.  Eyes: Conjunctivae and EOM are normal. No scleral icterus.  Neck: Normal range of motion.  Cardiovascular: Normal rate, regular rhythm and intact distal pulses.   Pulmonary/Chest: Effort normal. No respiratory distress. She has no wheezes.  Respirations even and unlabored  Abdominal: There is no tenderness. There is no guarding.  Soft, obese, nontender.  Musculoskeletal: Normal range of motion.  Neurological: She is alert. She exhibits normal muscle tone.  Coordination normal.  Patient moving all extremities. Able to ambulate to the commode.  Skin: Skin is warm and dry. No rash noted. She is not diaphoretic. No erythema. No pallor.  Psychiatric: She has a normal mood and affect. She is agitated (mild).  Nursing note and vitals reviewed.    ED Treatments / Results  Labs (all labs ordered are listed, but only abnormal results are displayed) Labs Reviewed  ETHANOL - Abnormal; Notable for the following:       Result Value   Alcohol, Ethyl (B) 56 (*)    All other components within normal limits  CBG MONITORING, ED - Abnormal; Notable for the following:    Glucose-Capillary 143 (*)    All other components within normal limits  I-STAT CHEM 8, ED - Abnormal; Notable for the following:    Potassium 3.4 (*)    BUN <3 (*)    Glucose, Bld 142 (*)    Hemoglobin 9.2 (*)    HCT 27.0 (*)    All other components within normal limits  RAPID URINE DRUG SCREEN, HOSP PERFORMED    EKG  EKG Interpretation None       Radiology No results found.  Procedures Procedures (including critical care time)  Medications Ordered in ED Medications  LORazepam (ATIVAN) injection 2 mg (2 mg Intramuscular Given 12/29/16 0255)     Initial Impression / Assessment and Plan / ED Course  I have reviewed the triage vital signs and the nursing notes.  Pertinent labs & imaging results that were available during my care of the patient were reviewed by me and considered in my medical decision making (see chart for details).     51 year old female presents to the emergency department for altered mental status. Symptoms suspected secondary to acute intoxication. Daughter reports that patient drank 32 ounces of a hurricane containing 10% alcohol. She was mildly agitated on arrival for which she was given Ativan. Vitals have remained stable and patient has been able to sleep in the department. She has been able to ambulate and transition to a commode without  difficulty. Laboratory workup reassuring, notable only for alcohol of 56. Anemia not suspected acute given lack of tachycardia and hypertension. No old for comparison.  Patient stable for discharge in the care of her daughter following further sobering. Daughter made aware of plan and is comfortable with pending discharge.   Vitals:   12/29/16 0226 12/29/16 0530 12/29/16 0600 12/29/16 0630  BP: 114/70 124/70 111/69 109/63  Pulse: (!) 101 90 87 86  Resp:  19 19 16   SpO2: 100% 98% 96% 99%  Weight:      Height:        Final Clinical Impressions(s) / ED Diagnoses   Final diagnoses:  Alcoholic intoxication with complication [facility])    New Prescriptions New Prescriptions   No medications on file     Antonietta Breach, PA-C 12/29/16 0705    Molpus, Jenny Reichmann, MD 12/29/16 [phone]

## 2016-12-29 NOTE — ED Provider Notes (Signed)
Family member called threatening lawsuit if we did not provide transportation back home.  Pt is ambulatory and has been eating.  Both pt and her daughter have been provided with a bus pass.  Security will drive pt and daughter to the bus.  The charge nurse verified that that bus does drop off by pt's house even on a Sunday.  I feel like we have done everything we can do to help this patient get home safely.   Isla Pence, MD 12/29/16 [phone]

## 2016-12-29 NOTE — ED Notes (Signed)
Upon d/c, pt daughter requested a xanax. She was advised that medications are only given to pts that are registered and we do not give meds to family members.

## 2016-12-29 NOTE — ED Notes (Addendum)
Pt ambulated with no assistance. Pt stated to her daughter that she feels much better.

## 2016-12-29 NOTE — ED Triage Notes (Signed)
Daughter states pt drank 32 oz with 10% alcohol and thought she took some amitriptyline not trying to hurt self.

## 2016-12-29 NOTE — ED Notes (Signed)
Daughter of pt arrived at desk requested to lie on the bed in [place] [facility] due to being pregnant. Writer explained chairs are provided in rooms for visitors that we can not let anyone sleep in open beds. Writer encouraged visitor to go home and rest a while. Daughter stated she needs to be her to translate due to her mother is deaf and we should be grateful for help since we don't have to call in a translator. She went on rambling about our lack of care then went back to sit with mother.

## 2016-12-29 NOTE — ED Notes (Signed)
Pt taken to bus stop by security accompanied by daughter Nicki Reaper

## 2016-12-29 NOTE — ED Notes (Signed)
Daughter request that pt sleep for about another hour. Pt and daughter provided with drinks and sandwich as well as 2 bus passes.

## 2016-12-29 NOTE — ED Notes (Signed)
Patients daughter has called her 65 in [place] who is lecturing and threatening with a law suit if we discharge pt from hospital without transportation home. Although clearly documented a bus pass has been provided for both daughter and pt with security to transport both to bus stop, they do not feel this is acceptable stating"if anything happens to either of them, I will sue you" caller asking if writer understands patient rights, in return yes was answered and caller informed we are abiding by them and looking after the safety of all involved. Caller them asked for information regarding pt, writer informed her she is not allowed to provide information due to HIPPA/pt rights.  After phone call, Sarajane Jews RN, [facility] for hospital,l updated.

## 2017-04-06 ENCOUNTER — Emergency Department (HOSPITAL_COMMUNITY): Payer: Medicare Other

## 2017-04-06 ENCOUNTER — Emergency Department (HOSPITAL_COMMUNITY)
Admission: EM | Admit: 2017-04-06 | Discharge: 2017-04-06 | Disposition: A | Discharge status: Home / Self Care | Payer: Medicare Other | Attending: Emergency Medicine | Admitting: Emergency Medicine

## 2017-04-06 ENCOUNTER — Encounter (HOSPITAL_COMMUNITY): Payer: Self-pay

## 2017-04-06 ENCOUNTER — Other Ambulatory Visit: Payer: Self-pay

## 2017-04-06 DIAGNOSIS — K047 Periapical abscess without sinus: Secondary | ICD-10-CM | POA: Insufficient documentation

## 2017-04-06 DIAGNOSIS — Z79899 Other long term (current) drug therapy: Secondary | ICD-10-CM | POA: Diagnosis not present

## 2017-04-06 DIAGNOSIS — J45909 Unspecified asthma, uncomplicated: Secondary | ICD-10-CM | POA: Diagnosis not present

## 2017-04-06 DIAGNOSIS — J069 Acute upper respiratory infection, unspecified: Secondary | ICD-10-CM

## 2017-04-06 DIAGNOSIS — R000 Tachycardia, unspecified: Secondary | ICD-10-CM | POA: Diagnosis not present

## 2017-04-06 DIAGNOSIS — K0889 Other specified disorders of teeth and supporting structures: Secondary | ICD-10-CM | POA: Diagnosis not present

## 2017-04-06 DIAGNOSIS — R4182 Altered mental status, unspecified: Secondary | ICD-10-CM | POA: Diagnosis not present

## 2017-04-06 DIAGNOSIS — I10 Essential (primary) hypertension: Secondary | ICD-10-CM | POA: Insufficient documentation

## 2017-04-06 MED ORDER — ACETAMINOPHEN-CODEINE 120-12 MG/5ML PO SOLN: 10.0000 mL | ORAL | 0 refills | Status: DC | PRN

## 2017-04-06 MED ORDER — GUAIFENESIN ER 1200 MG PO TB12: 1.0000 | ORAL_TABLET | Freq: Two times a day (BID) | ORAL | 0 refills | Status: DC

## 2017-04-06 MED ORDER — IBUPROFEN 800 MG PO TABS: 800.0000 mg | ORAL_TABLET | Freq: Three times a day (TID) | ORAL | 0 refills | Status: DC | PRN

## 2017-04-06 MED ORDER — AMOXICILLIN-POT CLAVULANATE 875-125 MG PO TABS: 1.0000 | ORAL_TABLET | Freq: Two times a day (BID) | ORAL | 0 refills | Status: DC

## 2017-04-06 NOTE — Discharge Instructions (Signed)
Return here as needed. follow-up with your dentist.  Your x-ray is normal I feel that she also have.

## 2017-04-06 NOTE — ED Provider Notes (Signed)
[facility] EMERGENCY DEPARTMENT Provider Note   CSN: [id number] Arrival date & time: 04/06/17  1829     History   Chief Complaint No chief complaint on file.   HPI Stacie Wolfe is a 51 y.o. female.  HPI Patient presents to the emergency department with dental pain along with nasal congestion and cough.  The patient has been dealing with this for the last 5 days.  Patient states that nothing seems to make the condition better or worse.  She states that she did not take any medications prior to arrival.  Patient states that her gums and teeth are very sensitive on the left upper dentition.  The patient denies chest pain, shortness of breath, headache,blurred vision, neck pain, fever, cough, weakness, numbness, dizziness, anorexia, edema, abdominal pain, nausea, vomiting, diarrhea, rash, back pain, dysuria, hematemesis, bloody stool, near syncope, or syncope. Past Medical History:  Diagnosis Date  . Asthma   . Deaf   . Hypertension   . Migraine     There are no active problems to display for this patient.   Past Surgical History:  Procedure Laterality Date  . ABDOMINAL SURGERY    . CHOLECYSTECTOMY    . COSMETIC SURGERY    . GASTRIC BYPASS    . TUBAL LIGATION      OB History    No data available       Home Medications    Prior to Admission medications   Medication Sig Start Date End Date Taking? Authorizing Provider  albuterol (PROVENTIL HFA;VENTOLIN HFA) 108 (90 Base) MCG/ACT inhaler Inhale 2 puffs into the lungs every 6 (six) hours as needed for wheezing or shortness of breath. 11/07/16   Clent Demark, PA-C  albuterol (PROVENTIL) (2.5 MG/3ML) 0.083% nebulizer solution Take 3 mLs (2.5 mg total) by nebulization every 6 (six) hours as needed for wheezing or shortness of breath. 11/07/16   Clent Demark, PA-C  amitriptyline (ELAVIL) 25 MG tablet Take 25 mg by mouth at bedtime.    [provider]  Aspirin-Salicylamide-Caffeine  (BC FAST PAIN RELIEF) [phone] MG PACK Take 1 Package by mouth daily as needed (pain).    [provider]  cyclobenzaprine (FLEXERIL) 10 MG tablet Take 1 tablet (10 mg total) by mouth 2 (two) times daily as needed for muscle spasms. 10/24/16   Montine Circle, PA-C  hydrochlorothiazide (HYDRODIURIL) 25 MG tablet Take 1 tablet (25 mg total) by mouth daily. Take on tablet in the morning. 11/07/16   Clent Demark, PA-C  hydrOXYzine (ATARAX/VISTARIL) 25 MG tablet Take 1 tablet (25 mg total) by mouth 2 (two) times daily as needed. 11/07/16   Clent Demark, PA-C  ibuprofen (ADVIL,MOTRIN) 800 MG tablet Take 1 tablet (800 mg total) by mouth 3 (three) times daily. 10/24/16   Montine Circle, PA-C    Family History History reviewed. No pertinent family history.  Social History Social History   Tobacco Use  . Smoking status: Never Smoker  . Smokeless tobacco: Never Used  Substance Use Topics  . Alcohol use: Yes    Comment: weekends   . Drug use: No     Allergies   Patient has no known allergies.   Review of Systems Review of Systems  All other systems negative except as documented in the HPI. All pertinent positives and negatives as reviewed in the HPI.  Physical Exam Updated Vital Signs Temp 98.6 F (37 C) (Oral)   Ht 5\' 2"  (1.575 m)   BMI 38.01  kg/m   Physical Exam  Constitutional: She is oriented to person, place, and time. She appears well-developed and well-nourished. No distress.  HENT:  Head: Normocephalic and atraumatic.  Mouth/Throat: Oropharynx is clear and moist.  Eyes: Pupils are equal, round, and reactive to light.  Neck: Normal range of motion. Neck supple.  Cardiovascular: Normal rate, regular rhythm and normal heart sounds. Exam reveals no gallop and no friction rub.  No murmur heard. Pulmonary/Chest: Effort normal and breath sounds normal. No respiratory distress. She has no wheezes.  Abdominal: Soft. Bowel sounds are normal. She exhibits  no distension. There is no tenderness.  Neurological: She is alert and oriented to person, place, and time. She exhibits normal muscle tone. Coordination normal.  Skin: Skin is warm and dry. Capillary refill takes less than 2 seconds. No rash noted. No erythema.  Psychiatric: She has a normal mood and affect. Her behavior is normal.  Nursing note and vitals reviewed.    ED Treatments / Results  Labs (all labs ordered are listed, but only abnormal results are displayed) Labs Reviewed - No data to display  EKG  EKG Interpretation None       Radiology Dg Chest 2 View  Result Date: 04/06/2017 CLINICAL DATA:  51 year old female with a history of altered mental status EXAM: CHEST  2 VIEW COMPARISON:  None. FINDINGS: Cardiomediastinal silhouette within normal limits. No evidence of central vascular congestion. No pneumothorax or pleural effusion. No confluent airspace disease. No interlobular septal thickening. No displaced fracture IMPRESSION: No radiographic evidence of acute cardiopulmonary disease Electronically Signed   By: Corrie Mckusick D.O.   On: 04/06/2017 08:51    Procedures Procedures (including critical care time)  Medications Ordered in ED Medications - No data to display   Initial Impression / Assessment and Plan / ED Course  I have reviewed the triage vital signs and the nursing notes.  Pertinent labs & imaging results that were available during my care of the patient were reviewed by me and considered in my medical decision making (see chart for details).    I feel that the patient has an upper respiratory infection based on the fact that she has nasal congestion and drainage along with cough she also has some complaints of dental pain does have poor dentition.  I feel that this could be multifactorial based on these factors.  I have advised her that we will treat for this.  I also advised her to follow-up with her dentist.  Patient agrees to the plan and all questions  were answered the patient did seem irritated through the interpreter that we had not figured out an exact cause of her symptoms although I do feel confident based off of the history of obtained that this is an upper respiratory infection.  Final Clinical Impressions(s) / ED Diagnoses   Final diagnoses:  None    ED Discharge Orders    None       Dalia Heading, PA-C 04/15/17 5885    Daleen Bo, MD 04/15/17 [phone]

## 2017-04-06 NOTE — ED Triage Notes (Addendum)
Interpreter reports patient voiced she drives a car sometimes. Pt. Then reported she medicaid and takes an amubulance every where she goes because it is free.

## 2017-04-06 NOTE — ED Notes (Signed)
Declined W/C at D/C and was escorted to lobby by RN. 

## 2017-04-06 NOTE — ED Triage Notes (Signed)
Pt. Via EMS reports dental pain 10/10. Pt. Is deaf and mute with visual deficits. Pt complains of back left molar pain. Pt. In no acute distress.

## 2017-04-07 ENCOUNTER — Encounter (HOSPITAL_COMMUNITY): Payer: Self-pay

## 2017-06-12 ENCOUNTER — Emergency Department (HOSPITAL_BASED_OUTPATIENT_CLINIC_OR_DEPARTMENT_OTHER)
Admission: EM | Admit: 2017-06-12 | Discharge: 2017-06-12 | Disposition: A | Discharge status: Home / Self Care | Payer: Medicare Other | Attending: Emergency Medicine | Admitting: Emergency Medicine

## 2017-06-12 ENCOUNTER — Encounter (HOSPITAL_BASED_OUTPATIENT_CLINIC_OR_DEPARTMENT_OTHER): Payer: Self-pay

## 2017-06-12 ENCOUNTER — Other Ambulatory Visit: Payer: Self-pay

## 2017-06-12 DIAGNOSIS — H6590 Unspecified nonsuppurative otitis media, unspecified ear: Secondary | ICD-10-CM | POA: Diagnosis not present

## 2017-06-12 DIAGNOSIS — R69 Illness, unspecified: Secondary | ICD-10-CM

## 2017-06-12 DIAGNOSIS — J111 Influenza due to unidentified influenza virus with other respiratory manifestations: Secondary | ICD-10-CM | POA: Insufficient documentation

## 2017-06-12 DIAGNOSIS — H9203 Otalgia, bilateral: Secondary | ICD-10-CM | POA: Diagnosis not present

## 2017-06-12 DIAGNOSIS — R05 Cough: Secondary | ICD-10-CM | POA: Diagnosis not present

## 2017-06-12 DIAGNOSIS — H65193 Other acute nonsuppurative otitis media, bilateral: Secondary | ICD-10-CM | POA: Diagnosis not present

## 2017-06-12 MED ORDER — AMOXICILLIN-POT CLAVULANATE 875-125 MG PO TABS
1.0000 | ORAL_TABLET | Freq: Two times a day (BID) | ORAL | 0 refills | Status: DC
Start: 2017-06-12 — End: 2017-06-16

## 2017-06-12 MED ORDER — OSELTAMIVIR PHOSPHATE 75 MG PO CAPS: 75.0000 mg | ORAL_CAPSULE | Freq: Two times a day (BID) | ORAL | 0 refills | Status: AC

## 2017-06-12 MED ORDER — ALBUTEROL SULFATE HFA 108 (90 BASE) MCG/ACT IN AERS
2.0000 | INHALATION_SPRAY | Freq: Once | RESPIRATORY_TRACT | Status: DC
  Filled 2017-06-12: qty 6.7

## 2017-06-12 MED ORDER — PROAIR HFA 108 (90 BASE) MCG/ACT IN AERS: 2.0000 | INHALATION_SPRAY | Freq: Four times a day (QID) | RESPIRATORY_TRACT | 0 refills | Status: DC | PRN

## 2017-06-12 NOTE — ED Provider Notes (Signed)
[facility] EMERGENCY DEPARTMENT Provider Note   CSN: [id number] Arrival date & time: 06/12/17  1159     History   Chief Complaint Chief Complaint  Patient presents with  . Cough    HPI  History obtained with help of patient's friend who is able to interpret in sign language  Stacie Wolfe is a 52 y.o. female presenting with bilateral ear and throat pain of 2 days duration. Patient endorses +cough, +postnasal drip. No fevers at home. No N/V/D/C, no abdominal pain. She does have a history of asthma but does not feel she has been wheezing and also does not have any inhalers left at home. She has been able to stay hydrated at home.  No chest pain.   Past Medical History:  Diagnosis Date  . Asthma   . Deaf   . Depression   . Hypertension   . Migraine   . Stroke [facility])     There are no active problems to display for this patient.   Past Surgical History:  Procedure Laterality Date  . ABDOMINAL SURGERY    . CHOLECYSTECTOMY    . COSMETIC SURGERY    . GASTRIC BYPASS    . TUBAL LIGATION      OB History    No data available       Home Medications    Prior to Admission medications   Medication Sig Start Date End Date Taking? Authorizing Provider  acetaminophen-codeine 120-12 MG/5ML solution Take 10 mLs by mouth every 4 (four) hours as needed for moderate pain. 04/06/17   Lawyer, Harrell Gave, PA-C  albuterol (PROVENTIL HFA;VENTOLIN HFA) 108 (90 Base) MCG/ACT inhaler Inhale 2 puffs into the lungs every 6 (six) hours as needed for wheezing or shortness of breath. 11/07/16   Clent Demark, PA-C  albuterol (PROVENTIL) (2.5 MG/3ML) 0.083% nebulizer solution Take 3 mLs (2.5 mg total) by nebulization every 6 (six) hours as needed for wheezing or shortness of breath. 11/07/16   Clent Demark, PA-C  albuterol (PROVENTIL) (2.5 MG/3ML) 0.083% nebulizer solution Inhale 2.5 mg into the lungs every 6 (six) hours as needed for wheezing or shortness of breath.   11/28/16   [provider]  amitriptyline (ELAVIL) 25 MG tablet Take 25 mg by mouth at bedtime as needed for sleep.  09/24/16   [provider]  amoxicillin-clavulanate (AUGMENTIN) 875-125 MG tablet Take 1 tablet by mouth 2 (two) times daily for 7 days. 06/12/17 06/19/17  Everrett Coombe, MD  cyclobenzaprine (FLEXERIL) 10 MG tablet Take 1 tablet (10 mg total) by mouth 2 (two) times daily as needed for muscle spasms. Patient not taking: Reported on 04/06/2017 10/24/16   Montine Circle, PA-C  cyclobenzaprine (FLEXERIL) 10 MG tablet Take 10 mg by mouth 2 (two) times daily as needed for muscle spasms.  10/24/16   [provider]  Guaifenesin 1200 MG TB12 Take 1 tablet (1,200 mg total) by mouth 2 (two) times daily. 04/06/17   Lawyer, Harrell Gave, PA-C  hydrochlorothiazide (HYDRODIURIL) 25 MG tablet Take 1 tablet (25 mg total) by mouth daily. Take on tablet in the morning. Patient not taking: Reported on 04/06/2017 11/07/16   Clent Demark, PA-C  hydrochlorothiazide (HYDRODIURIL) 25 MG tablet Take 25 mg by mouth daily after breakfast. 11/07/16   [provider]  hydrOXYzine (ATARAX/VISTARIL) 25 MG tablet Take 1 tablet (25 mg total) by mouth 2 (two) times daily as needed. Patient not taking: Reported on 04/06/2017 11/07/16   Clent Demark, PA-C  hydrOXYzine (  ATARAX/VISTARIL) 25 MG tablet Take 25 mg by mouth 2 (two) times daily as needed. 11/07/16   [provider]  ibuprofen (ADVIL,MOTRIN) 800 MG tablet Take 1 tablet (800 mg total) by mouth every 8 (eight) hours as needed. 04/06/17   Lawyer, Harrell Gave, PA-C  ibuprofen (ADVIL,MOTRIN) 800 MG tablet Take 800 mg by mouth every 8 (eight) hours as needed for headache, mild pain or moderate pain.  10/24/16   [provider]  oseltamivir (TAMIFLU) 75 MG capsule Take 1 capsule (75 mg total) by mouth 2 (two) times daily for 5 days. 06/12/17 06/17/17  Everrett Coombe, MD  PROAIR HFA 108 [phone] Base) MCG/ACT inhaler Inhale 2  puffs into the lungs 4 (four) times daily as needed for wheezing or shortness of breath.  12/20/16   [provider]  SUMAtriptan (IMITREX) 100 MG tablet Take 100 mg by mouth every 2 (two) hours as needed for migraine or headache.  11/08/16   [provider]    Family History No family history on file.  Social History Social History   Tobacco Use  . Smoking status: Never Smoker  . Smokeless tobacco: Never Used  Substance Use Topics  . Alcohol use: Yes    Comment: weekends   . Drug use: No     Allergies   Verapamil   Review of Systems Review of Systems   Physical Exam Updated Vital Signs BP (!) 174/93 (BP Location: Left Arm)   Pulse (!) 103   Temp 98.4 F (36.9 C) (Oral)   Resp 18   Ht 5\' 3"  (1.6 m)   Wt 87.1 kg (192 lb)   SpO2 98%   BMI 34.01 kg/m   Physical Exam  Constitutional: She is oriented to person, place, and time. She appears well-developed and well-nourished.  HENT:  Right Ear: A middle ear effusion is present.  Left Ear: Tympanic membrane is erythematous.  Mouth/Throat: Oropharynx is clear and moist. No oropharyngeal exudate.  tenderness of the ear on exam with effusion. Tenderness of the neck along the eustachian tube without palpable abscess, no overlying skin changes.   Neck: Neck supple.  Cardiovascular: Normal rate and regular rhythm. Exam reveals no gallop and no friction rub.  No murmur heard. Pulmonary/Chest: No respiratory distress. She has no wheezes. She has no rales.  Abdominal: Soft. She exhibits no distension. There is no tenderness. There is no guarding.  Musculoskeletal: She exhibits no edema.  Neurological: She is alert and oriented to person, place, and time.  Skin: Skin is warm and dry. Capillary refill takes less than 2 seconds.  Psychiatric: She has a normal mood and affect.     ED Treatments / Results  Labs (all labs ordered are listed, but only abnormal results are displayed) Labs Reviewed - No data to  display  EKG  EKG Interpretation None       Radiology No results found.  Procedures Procedures (including critical care time)  Medications Ordered in ED Medications  albuterol (PROVENTIL HFA;VENTOLIN HFA) 108 (90 Base) MCG/ACT inhaler 2 puff (not administered)     Initial Impression / Assessment and Plan / ED Course  I have reviewed the triage vital signs and the nursing notes.  Pertinent labs & imaging results that were available during my care of the patient were reviewed by me and considered in my medical decision making (see chart for details).    52 yo F with history of deafness presents generalized muscle aches, congestion and rhinorrhea consistent with flu, and she  was prescribed tamiflu due to history of asthma. She additionally is noted to have tenderness of the ear on exam with effusion. Tenderness of the neck along the eustachian tube without palpable abscess, no overlying skin changes.  Patient was treated for ear infection with Augmentin. She was also noted to have dental caries and so dental resources were provided. Albuterol inhaler was given because she does not have one available at home.  Return precautions provided.  Final Clinical Impressions(s) / ED Diagnoses   Final diagnoses:  Influenza-like illness  Other nonsuppurative otitis media, unspecified chronicity, unspecified laterality    ED Discharge Orders        Ordered    oseltamivir (TAMIFLU) 75 MG capsule  2 times daily     06/12/17 1523    amoxicillin-clavulanate (AUGMENTIN) 875-125 MG tablet  2 times daily     06/12/17 1523       Everrett Coombe, MD 06/12/17 0539    Gareth Morgan, MD 06/13/17 [id number]

## 2017-06-12 NOTE — ED Triage Notes (Signed)
C/o flu like sx x 2 days-pt is deaf with interpreter/friend-NAD-steady gait

## 2017-06-12 NOTE — Discharge Instructions (Addendum)
You were seen in the emergency department for your viral symptoms. You were prescribed tamiflu as well as antibiotics for an ear infection.  Reasons to return to care would be if you have shortness of breath that is not resolving, or if you are unable to stay hydrated by mouth.  Please schedule follow up to be seen by your regular doctor within the next 5-7 days.

## 2017-06-12 NOTE — ED Notes (Signed)
EDP is at bedside. 

## 2017-06-15 ENCOUNTER — Encounter (HOSPITAL_COMMUNITY): Payer: Self-pay | Admitting: Emergency Medicine

## 2017-06-15 ENCOUNTER — Emergency Department (HOSPITAL_COMMUNITY)
Admission: EM | Admit: 2017-06-15 | Discharge: 2017-06-16 | Disposition: A | Discharge status: Home / Self Care | Payer: Medicare Other | Attending: Emergency Medicine | Admitting: Emergency Medicine

## 2017-06-15 ENCOUNTER — Emergency Department (HOSPITAL_COMMUNITY): Payer: Medicare Other

## 2017-06-15 DIAGNOSIS — Z8673 Personal history of transient ischemic attack (TIA), and cerebral infarction without residual deficits: Secondary | ICD-10-CM | POA: Diagnosis not present

## 2017-06-15 DIAGNOSIS — R69 Illness, unspecified: Secondary | ICD-10-CM

## 2017-06-15 DIAGNOSIS — J45909 Unspecified asthma, uncomplicated: Secondary | ICD-10-CM | POA: Diagnosis not present

## 2017-06-15 DIAGNOSIS — I10 Essential (primary) hypertension: Secondary | ICD-10-CM | POA: Insufficient documentation

## 2017-06-15 DIAGNOSIS — Z79899 Other long term (current) drug therapy: Secondary | ICD-10-CM | POA: Diagnosis not present

## 2017-06-15 DIAGNOSIS — H66002 Acute suppurative otitis media without spontaneous rupture of ear drum, left ear: Secondary | ICD-10-CM | POA: Diagnosis not present

## 2017-06-15 DIAGNOSIS — H9202 Otalgia, left ear: Secondary | ICD-10-CM | POA: Diagnosis present

## 2017-06-15 DIAGNOSIS — J111 Influenza due to unidentified influenza virus with other respiratory manifestations: Secondary | ICD-10-CM | POA: Insufficient documentation

## 2017-06-15 DIAGNOSIS — H5710 Ocular pain, unspecified eye: Secondary | ICD-10-CM | POA: Diagnosis not present

## 2017-06-15 DIAGNOSIS — J9811 Atelectasis: Secondary | ICD-10-CM | POA: Diagnosis not present

## 2017-06-15 LAB — COMPREHENSIVE METABOLIC PANEL
ALK PHOS: 87 U/L (ref 38–126)
ALT: 13 U/L — ABNORMAL LOW (ref 14–54)
ANION GAP: 10 (ref 5–15)
AST: 19 U/L (ref 15–41)
Albumin: 3 g/dL — ABNORMAL LOW (ref 3.5–5.0)
BILIRUBIN TOTAL: 1.4 mg/dL — AB (ref 0.3–1.2)
BUN: 6 mg/dL (ref 6–20)
CALCIUM: 9.4 mg/dL (ref 8.9–10.3)
CO2: 22 mmol/L (ref 22–32)
Chloride: 109 mmol/L (ref 101–111)
Creatinine, Ser: 0.59 mg/dL (ref 0.44–1.00)
GFR calc non Af Amer: >60 mL/min (ref >60)
Glucose, Bld: 104 mg/dL — ABNORMAL HIGH (ref 65–99)
Potassium: 3.6 mmol/L (ref 3.5–5.1)
SODIUM: 141 mmol/L (ref 135–145)
TOTAL PROTEIN: 6.7 g/dL (ref 6.5–8.1)

## 2017-06-15 LAB — URINALYSIS, ROUTINE W REFLEX MICROSCOPIC
Bilirubin Urine: NEGATIVE
Glucose, UA: NEGATIVE mg/dL
Hgb urine dipstick: NEGATIVE
Ketones, ur: NEGATIVE mg/dL
Leukocytes, UA: NEGATIVE
Nitrite: NEGATIVE
Protein, ur: NEGATIVE mg/dL
Specific Gravity, Urine: 1.016 (ref 1.005–1.030)
pH: 5 (ref 5.0–8.0)

## 2017-06-15 LAB — CBC WITH DIFFERENTIAL/PLATELET
BASOS ABS: 0.1 10*3/uL (ref 0.0–0.1)
Basophils Relative: 1 %
EOS ABS: 0 10*3/uL (ref 0.0–0.7)
Eosinophils Relative: 0 %
HCT: 29.9 % — ABNORMAL LOW (ref 36.0–46.0)
HEMOGLOBIN: 8.1 g/dL — AB (ref 12.0–15.0)
LYMPHS PCT: 10 %
Lymphs Abs: 1 10*3/uL (ref 0.7–4.0)
MCH: 17.5 pg — ABNORMAL LOW (ref 26.0–34.0)
MCHC: 27.1 g/dL — ABNORMAL LOW (ref 30.0–36.0)
MCV: 64.7 fL — ABNORMAL LOW (ref 78.0–100.0)
Monocytes Absolute: 0.8 10*3/uL (ref 0.1–1.0)
Monocytes Relative: 8 %
NEUTROS PCT: 81 %
Neutro Abs: 7.9 10*3/uL — ABNORMAL HIGH (ref 1.7–7.7)
Platelets: 543 10*3/uL — ABNORMAL HIGH (ref 150–400)
RBC: 4.62 MIL/uL (ref 3.87–5.11)
RDW: 24.6 % — ABNORMAL HIGH (ref 11.5–15.5)
WBC: 9.8 10*3/uL (ref 4.0–10.5)

## 2017-06-15 LAB — I-STAT CG4 LACTIC ACID, ED
LACTIC ACID, VENOUS: 1.86 mmol/L (ref 0.5–1.9)
Lactic Acid, Venous: 2.85 mmol/L (ref 0.5–1.9)

## 2017-06-15 MED ORDER — IBUPROFEN 400 MG PO TABS
600.0000 mg | ORAL_TABLET | Freq: Once | ORAL | Status: AC
  Administered 2017-06-15: 600 mg via ORAL
  Filled 2017-06-15: qty 1

## 2017-06-15 MED ORDER — SODIUM CHLORIDE 0.9 % IV BOLUS (SEPSIS)
1000.0000 mL | Freq: Once | INTRAVENOUS | Status: AC
  Administered 2017-06-15: 1000 mL via INTRAVENOUS

## 2017-06-15 MED ORDER — SODIUM CHLORIDE 0.9 % IV SOLN
3.0000 g | Freq: Once | INTRAVENOUS | Status: DC
  Filled 2017-06-15: qty 3

## 2017-06-15 MED ORDER — ACETAMINOPHEN 500 MG PO TABS
1000.0000 mg | ORAL_TABLET | Freq: Once | ORAL | Status: AC
  Administered 2017-06-16: 1000 mg via ORAL
  Filled 2017-06-15: qty 2

## 2017-06-15 MED ORDER — DEXTROSE 5 % IV SOLN: 2.0000 g | Freq: Once | INTRAVENOUS | Status: DC

## 2017-06-15 NOTE — ED Notes (Signed)
Patient is deaf- patient waiting in the lobby at this time.

## 2017-06-15 NOTE — ED Notes (Signed)
Santiago Glad N.RN nurse first informed of pt Lactic Acid value. Ed-lab

## 2017-06-15 NOTE — ED Notes (Signed)
Pt is in a wheelchair in lobby seen wearing a blue hoodie. Pt is deaf and so is pt visitor.

## 2017-06-15 NOTE — ED Notes (Signed)
Sign Language Interpreter called @ 0917-per Dr. Ellender Hose

## 2017-06-15 NOTE — ED Triage Notes (Addendum)
To ed via [facility] from home, with c/o "I still feel bad" was dx with flu on 1/31 at [facility]  Pt has been taking tamiflu, but says "It isn't working" also not taking the amoxicillin because pt states it is "too big to swallow" requests liquid.  Pt states she is unable to see out of right eye, due to pain.,  Pt is deaf-- moaning in triage and kicking feet-- using interpreter -- pt does not want to take tylenol or motrin, states they do not help.

## 2017-06-16 DIAGNOSIS — H66002 Acute suppurative otitis media without spontaneous rupture of ear drum, left ear: Secondary | ICD-10-CM | POA: Diagnosis not present

## 2017-06-16 MED ORDER — AMOXICILLIN-POT CLAVULANATE 400-57 MG/5ML PO SUSR: 875.0000 mg | Freq: Two times a day (BID) | ORAL | 0 refills | Status: AC

## 2017-06-16 MED ORDER — ONDANSETRON 4 MG PO TBDP: 4.0000 mg | ORAL_TABLET | Freq: Three times a day (TID) | ORAL | 0 refills | Status: DC | PRN

## 2017-06-16 NOTE — ED Provider Notes (Signed)
[facility] EMERGENCY DEPARTMENT Provider Note   CSN: [id number] Arrival date & time: 06/15/17  1710     History   Chief Complaint Chief Complaint  Patient presents with  . flu like symptoms  . Dental Pain    HPI Lashundra Corin Formisano is a 52 y.o. female.  HPI   52 yo F with PMHx as below here with multiple complaints. History provided with sign interpreter.  She reports that her symptoms started several days ago with diffuse body aches, chills, cough, and ear pain.  She was seen in the ED on 1/31 and given Tamiflu and Augmentin.  She is been unable to take the Augmentin because the pill is too big for her.  She reports that she has subsequently felt progressively worse.  She has had persistent fevers.  She endorses left ear pain and fullness.  Mild sore throat.  No difficulty swallowing or neck pain or stiffness.  Denies any headache.  She feels like she has pain in her eyes but states that this is more so when she leans forward due to pain in her sinuses.  No other medical complaints.  She has not been able to eat and drink very much.  No nausea.  No vomiting.  No diarrhea.  No chest pain.  No cough or shortness of breath.  Past Medical History:  Diagnosis Date  . Asthma   . Deaf   . Depression   . Hypertension   . Migraine   . Stroke [facility])     There are no active problems to display for this patient.   Past Surgical History:  Procedure Laterality Date  . ABDOMINAL SURGERY    . CHOLECYSTECTOMY    . COSMETIC SURGERY    . GASTRIC BYPASS    . TUBAL LIGATION      OB History    No data available       Home Medications    Prior to Admission medications   Medication Sig Start Date End Date Taking? Authorizing Provider  acetaminophen-codeine 120-12 MG/5ML solution Take 10 mLs by mouth every 4 (four) hours as needed for moderate pain. 04/06/17  Yes Lawyer, Harrell Gave, PA-C  GuaiFENesin (MUCINEX PO) Take by mouth See admin instructions. Take 1/2 capful  by mouth twice daily as needed for congestion   Yes [provider]  ibuprofen (ADVIL,MOTRIN) 800 MG tablet Take 1 tablet (800 mg total) by mouth every 8 (eight) hours as needed. Patient taking differently: Take 800 mg by mouth every 8 (eight) hours as needed for fever or headache (pain).  04/06/17  Yes Lawyer, Harrell Gave, PA-C  oseltamivir (TAMIFLU) 75 MG capsule Take 1 capsule (75 mg total) by mouth 2 (two) times daily for 5 days. Patient taking differently: Take 75 mg by mouth 2 (two) times daily. 5 day course picked up 06/13/17 06/12/17 06/17/17 Yes Everrett Coombe, MD  PROAIR HFA 108 [phone] Base) MCG/ACT inhaler Inhale 2 puffs into the lungs 4 (four) times daily as needed for wheezing or shortness of breath. 06/12/17  Yes Everrett Coombe, MD  albuterol (PROVENTIL HFA;VENTOLIN HFA) 108 (90 Base) MCG/ACT inhaler Inhale 2 puffs into the lungs every 6 (six) hours as needed for wheezing or shortness of breath. Patient not taking: Reported on 06/15/2017 11/07/16   Clent Demark, PA-C  albuterol (PROVENTIL) (2.5 MG/3ML) 0.083% nebulizer solution Take 3 mLs (2.5 mg total) by nebulization every 6 (six) hours as needed for wheezing or shortness of breath. Patient not taking: Reported on  06/15/2017 11/07/16   Clent Demark, PA-C  amoxicillin-clavulanate (AUGMENTIN) 400-57 MG/5ML suspension Take 10.9 mLs (875 mg total) by mouth 2 (two) times daily for 10 days. 06/16/17 06/26/17  Duffy Bruce, MD  cyclobenzaprine (FLEXERIL) 10 MG tablet Take 1 tablet (10 mg total) by mouth 2 (two) times daily as needed for muscle spasms. Patient not taking: Reported on 04/06/2017 10/24/16   Montine Circle, PA-C  Guaifenesin 1200 MG TB12 Take 1 tablet (1,200 mg total) by mouth 2 (two) times daily. Patient not taking: Reported on 06/15/2017 04/06/17   Dalia Heading, PA-C  hydrochlorothiazide (HYDRODIURIL) 25 MG tablet Take 1 tablet (25 mg total) by mouth daily. Take on tablet in the morning. Patient not taking: Reported on  04/06/2017 11/07/16   Clent Demark, PA-C  hydrOXYzine (ATARAX/VISTARIL) 25 MG tablet Take 1 tablet (25 mg total) by mouth 2 (two) times daily as needed. Patient not taking: Reported on 04/06/2017 11/07/16   Clent Demark, PA-C  ondansetron [facility] ODT) 4 MG disintegrating tablet Take 1 tablet (4 mg total) by mouth every 8 (eight) hours as needed for nausea or vomiting. 06/16/17   Duffy Bruce, MD    Family History No family history on file.  Social History Social History   Tobacco Use  . Smoking status: Never Smoker  . Smokeless tobacco: Never Used  Substance Use Topics  . Alcohol use: Yes    Comment: weekends   . Drug use: No     Allergies   Verapamil   Review of Systems Review of Systems  Constitutional: Positive for chills and fatigue. Negative for fever.  HENT: Positive for ear pain and sore throat. Negative for congestion and rhinorrhea.   Eyes: Negative for visual disturbance.  Respiratory: Negative for cough, shortness of breath and wheezing.   Cardiovascular: Negative for chest pain and leg swelling.  Gastrointestinal: Negative for abdominal pain, diarrhea, nausea and vomiting.  Genitourinary: Negative for dysuria and flank pain.  Musculoskeletal: Negative for neck pain and neck stiffness.  Skin: Negative for rash and wound.  Allergic/Immunologic: Negative for immunocompromised state.  Neurological: Negative for syncope, weakness and headaches.  All other systems reviewed and are negative.    Physical Exam Updated Vital Signs BP 134/83 (BP Location: Left Arm)   Pulse 97   Temp (!) 100.7 F (38.2 C) (Oral)   Resp 18   SpO2 100%   Physical Exam  Constitutional: She is oriented to person, place, and time. She appears well-developed and well-nourished. No distress.  HENT:  Head: Normocephalic and atraumatic.  Mildly dry MM. Left TM opaque and erythematous. No mastoid erythema. No peritonsillar asymmetry. Uvula is midline  Eyes: Conjunctivae are  normal.  Neck: Neck supple.  Cardiovascular: Normal rate, regular rhythm and normal heart sounds. Exam reveals no friction rub.  No murmur heard. Pulmonary/Chest: Effort normal and breath sounds normal. No respiratory distress. She has no wheezes. She has no rales.  Abdominal: Soft. She exhibits no distension.  Musculoskeletal: She exhibits no edema.  Neurological: She is alert and oriented to person, place, and time. She exhibits normal muscle tone.  Skin: Skin is warm. Capillary refill takes less than 2 seconds.  Psychiatric: She has a normal mood and affect.  Nursing note and vitals reviewed.    ED Treatments / Results  Labs (all labs ordered are listed, but only abnormal results are displayed) Labs Reviewed  COMPREHENSIVE METABOLIC PANEL - Abnormal; Notable for the following components:      Result Value  Glucose, Bld 104 (*)    Albumin 3.0 (*)    ALT 13 (*)    Total Bilirubin 1.4 (*)    All other components within normal limits  CBC WITH DIFFERENTIAL/PLATELET - Abnormal; Notable for the following components:   Hemoglobin 8.1 (*)    HCT 29.9 (*)    MCV 64.7 (*)    MCH 17.5 (*)    MCHC 27.1 (*)    RDW 24.6 (*)    Platelets 543 (*)    Neutro Abs 7.9 (*)    All other components within normal limits  URINALYSIS, ROUTINE W REFLEX MICROSCOPIC - Abnormal; Notable for the following components:   APPearance HAZY (*)    All other components within normal limits  I-STAT CG4 LACTIC ACID, ED - Abnormal; Notable for the following components:   Lactic Acid, Venous 2.85 (*)    All other components within normal limits  I-STAT CG4 LACTIC ACID, ED  I-STAT CG4 LACTIC ACID, ED    EKG  EKG Interpretation None       Radiology Dg Chest 2 View  Result Date: 06/15/2017 CLINICAL DATA:  To ed via [facility] from home, with c/o "I still feel bad" was dx with flu on 1/31 [facility] Pt has been taking Tamiflu, but says "It isn't working" also not taking the amoxicillin because  pt states is too big to swallow. EXAM: CHEST  2 VIEW COMPARISON:  04/06/2017 FINDINGS: Shallow lung inflation. Heart size is upper limits normal. There are no focal consolidations or pleural effusions. IMPRESSION: Shallow inflation. No evidence for acute cardiopulmonary abnormality. Electronically Signed   By: Nolon Nations M.D.   On: 06/15/2017 18:16    Procedures Procedures (including critical care time)  Medications Ordered in ED Medications  sodium chloride 0.9 % bolus 1,000 mL (1,000 mLs Intravenous New Bag/Given 06/15/17 2306)  sodium chloride 0.9 % bolus 1,000 mL (1,000 mLs Intravenous New Bag/Given 06/15/17 2306)  acetaminophen (TYLENOL) tablet 1,000 mg (1,000 mg Oral Given 06/16/17 0000)  ibuprofen (ADVIL,MOTRIN) tablet 600 mg (600 mg Oral Given 06/15/17 2359)     Initial Impression / Assessment and Plan / ED Course  I have reviewed the triage vital signs and the nursing notes.  Pertinent labs & imaging results that were available during my care of the patient were reviewed by me and considered in my medical decision making (see chart for details).      52 yo F here with ongoing fevers, ear pain, chills in setting of recent ILI. On exam, pt has persistent left otitis and appears clinically dehydrated. Suspect ongoing influenza like illness, on Tamiflu, also complicated by the fact that she was unable to take her Augmentin 2/2 difficulty swallowing pills. Initial LA mildly elevated but BP, VS otherwise reassuring. Will give fluids, fever control, and re-assess.  Pt improved with fluids. LA clearing. She was given Unasyn and is able to tolerate PO now in ED. Will treat with Augmentin (liquid), d/c with ongoing supportive care, fluids, and good return precautions.  Final Clinical Impressions(s) / ED Diagnoses   Final diagnoses:  Acute suppurative otitis media of left ear without spontaneous rupture of tympanic membrane, recurrence not specified  Influenza-like illness    ED  Discharge Orders        Ordered    amoxicillin-clavulanate (AUGMENTIN) 400-57 MG/5ML suspension  2 times daily     06/16/17 0026    ondansetron (ZOFRAN ODT) 4 MG disintegrating tablet  Every 8 hours PRN  06/16/17 4462       Duffy Bruce, MD 06/16/17 [phone]

## 2017-06-16 NOTE — Discharge Instructions (Signed)
It is very important that you take the medication as prescribed.  Take the Zofran as needed for nausea.  I recommend taking motrin 600 mg every 8 hours as needed for fever and pain. You can buy liquid motrin if you have a hard time swallowing pills.

## 2017-07-13 ENCOUNTER — Encounter (HOSPITAL_COMMUNITY): Payer: Self-pay | Admitting: *Deleted

## 2017-07-13 ENCOUNTER — Other Ambulatory Visit: Payer: Self-pay

## 2017-07-13 ENCOUNTER — Emergency Department (HOSPITAL_COMMUNITY)
Admission: EM | Admit: 2017-07-13 | Discharge: 2017-07-13 | Disposition: A | Discharge status: Home / Self Care | Payer: Medicare Other | Attending: Emergency Medicine | Admitting: Emergency Medicine

## 2017-07-13 ENCOUNTER — Emergency Department (HOSPITAL_COMMUNITY): Payer: Medicare Other

## 2017-07-13 DIAGNOSIS — M47814 Spondylosis without myelopathy or radiculopathy, thoracic region: Secondary | ICD-10-CM | POA: Diagnosis not present

## 2017-07-13 DIAGNOSIS — Z8673 Personal history of transient ischemic attack (TIA), and cerebral infarction without residual deficits: Secondary | ICD-10-CM | POA: Insufficient documentation

## 2017-07-13 DIAGNOSIS — M546 Pain in thoracic spine: Secondary | ICD-10-CM | POA: Diagnosis not present

## 2017-07-13 DIAGNOSIS — M25511 Pain in right shoulder: Secondary | ICD-10-CM | POA: Insufficient documentation

## 2017-07-13 DIAGNOSIS — Z79899 Other long term (current) drug therapy: Secondary | ICD-10-CM | POA: Diagnosis not present

## 2017-07-13 DIAGNOSIS — J45909 Unspecified asthma, uncomplicated: Secondary | ICD-10-CM | POA: Diagnosis not present

## 2017-07-13 DIAGNOSIS — M545 Low back pain: Secondary | ICD-10-CM | POA: Insufficient documentation

## 2017-07-13 DIAGNOSIS — M549 Dorsalgia, unspecified: Secondary | ICD-10-CM

## 2017-07-13 MED ORDER — METHOCARBAMOL 500 MG PO TABS: 500.0000 mg | ORAL_TABLET | Freq: Three times a day (TID) | ORAL | 0 refills | Status: DC | PRN

## 2017-07-13 MED ORDER — NAPROXEN 250 MG PO TABS
500.0000 mg | ORAL_TABLET | Freq: Once | ORAL | Status: AC
  Administered 2017-07-13: 500 mg via ORAL
  Filled 2017-07-13: qty 2

## 2017-07-13 MED ORDER — NAPROXEN 375 MG PO TABS: 375.0000 mg | ORAL_TABLET | Freq: Two times a day (BID) | ORAL | 0 refills | Status: DC

## 2017-07-13 NOTE — ED Triage Notes (Signed)
Pt rolled to BR in [facility]  And then to x-ray.

## 2017-07-13 NOTE — ED Triage Notes (Signed)
Used sign language interpretor at triage. Pt reports going to grocery store and was walking out, had onset of upper back pain and right shoulder pain.

## 2017-07-13 NOTE — ED Notes (Signed)
Interpreter is on their way, will be here by 1:45

## 2017-07-13 NOTE — Discharge Instructions (Signed)
Back Pain:  I have prescribed you an anti-inflammatory medication and a muscle relaxer.   Naproxen is a nonsteroidal anti-inflammatory medication that will help with pain and swelling. Be sure to take this medication as prescribed with food, 1 pill every 12 hours,  It should be taken with food, as it can cause stomach upset, and more seriously, stomach bleeding. Do not take other nonsteroidal anti-inflammatory medications with this such as Advil, Motrin, or Aleve.   Robaxin is the muscle relaxer I have prescribed, this is meant to help with muscle tightness. Be aware that this medication may make you drowsy therefore the first time you take this it should be at a time you are in an environment where you can rest. Do not drive or operate heavy machinery when taking this medication.   In addition you may also take Tylenol. Tylenol is generally safe, though you should not take more than 8 of the extra strength (500mg ) pills a day.  The application of heat can help soothe the pain.  Maintaining your daily activities, including walking, is encourged, as it will help you get better faster than just staying in bed.  Your pain should get better over the next 2 weeks.  You will need to follow up with  Your primary healthcare provider in 1-2 weeks for reassessment, if you do not have a primary care provider one is provided in your discharge instructions. However if you develop severe or worsening pain, low back pain with fever, numbness, weakness, loss of bowel or bladder control, or inability to walk or urinate, you should return to the ER immediately.  Please follow up with your doctor this week for a recheck if still having symptoms.

## 2017-07-13 NOTE — ED Provider Notes (Signed)
[facility] EMERGENCY DEPARTMENT Provider Note   CSN: [id number] Arrival date & time: 07/13/17  1230     History   Chief Complaint Chief Complaint  Patient presents with  . Back Pain    HPI Stacie Wolfe is a 52 y.o. female with a hx of HTN, asthma, and stroke who presents to the ED with complaint of R upper back pain and shoulder pain this morning. Hx provided with sign interpreter at bedside- remained at bedside throughout encounter. Patient states she was in [place] pushing a cart through/around the aisle with onset of pain to the R upper back and R shoulder. States pain is an aching/throbbing type pain that is a 10/10 in severity, worse with trunk twisting/turning motions and with movement of the RUE. Improved with sitting still. No medications or interventions prior to arrival. Denies numbness, tingling, weakness, incontinence to bowel/bladder, fever, chills, IV drug use, or hx of cancer. Denies dyspnea or chest pain.   HPI  Past Medical History:  Diagnosis Date  . Asthma   . Deaf   . Depression   . Hypertension   . Migraine   . Stroke [facility])     There are no active problems to display for this patient.   Past Surgical History:  Procedure Laterality Date  . ABDOMINAL SURGERY    . CHOLECYSTECTOMY    . COSMETIC SURGERY    . GASTRIC BYPASS    . TUBAL LIGATION      OB History    No data available       Home Medications    Prior to Admission medications   Medication Sig Start Date End Date Taking? Authorizing Provider  acetaminophen-codeine 120-12 MG/5ML solution Take 10 mLs by mouth every 4 (four) hours as needed for moderate pain. 04/06/17   Lawyer, Harrell Gave, PA-C  albuterol (PROVENTIL HFA;VENTOLIN HFA) 108 (90 Base) MCG/ACT inhaler Inhale 2 puffs into the lungs every 6 (six) hours as needed for wheezing or shortness of breath. Patient not taking: Reported on 06/15/2017 11/07/16   Clent Demark, PA-C  albuterol (PROVENTIL) (2.5  MG/3ML) 0.083% nebulizer solution Take 3 mLs (2.5 mg total) by nebulization every 6 (six) hours as needed for wheezing or shortness of breath. Patient not taking: Reported on 06/15/2017 11/07/16   Clent Demark, PA-C  cyclobenzaprine (FLEXERIL) 10 MG tablet Take 1 tablet (10 mg total) by mouth 2 (two) times daily as needed for muscle spasms. Patient not taking: Reported on 04/06/2017 10/24/16   Montine Circle, PA-C  GuaiFENesin (MUCINEX PO) Take by mouth See admin instructions. Take 1/2 capful by mouth twice daily as needed for congestion    [provider]  Guaifenesin 1200 MG TB12 Take 1 tablet (1,200 mg total) by mouth 2 (two) times daily. Patient not taking: Reported on 06/15/2017 04/06/17   Dalia Heading, PA-C  hydrochlorothiazide (HYDRODIURIL) 25 MG tablet Take 1 tablet (25 mg total) by mouth daily. Take on tablet in the morning. Patient not taking: Reported on 04/06/2017 11/07/16   Clent Demark, PA-C  hydrOXYzine (ATARAX/VISTARIL) 25 MG tablet Take 1 tablet (25 mg total) by mouth 2 (two) times daily as needed. Patient not taking: Reported on 04/06/2017 11/07/16   Clent Demark, PA-C  ibuprofen (ADVIL,MOTRIN) 800 MG tablet Take 1 tablet (800 mg total) by mouth every 8 (eight) hours as needed. Patient taking differently: Take 800 mg by mouth every 8 (eight) hours as needed for fever or headache (pain).  04/06/17   Lawyer,  Christopher, PA-C  ondansetron (ZOFRAN ODT) 4 MG disintegrating tablet Take 1 tablet (4 mg total) by mouth every 8 (eight) hours as needed for nausea or vomiting. 06/16/17   Duffy Bruce, MD  PROAIR HFA 108 [phone] Base) MCG/ACT inhaler Inhale 2 puffs into the lungs 4 (four) times daily as needed for wheezing or shortness of breath. 06/12/17   Everrett Coombe, MD    Family History History reviewed. No pertinent family history.  Social History Social History   Tobacco Use  . Smoking status: Never Smoker  . Smokeless tobacco: Never Used  Substance Use  Topics  . Alcohol use: Yes    Comment: weekends   . Drug use: No     Allergies   Verapamil   Review of Systems Review of Systems  Constitutional: Negative for chills and fever.  Respiratory: Negative for cough and shortness of breath.   Cardiovascular: Negative for chest pain and leg swelling.  Gastrointestinal: Negative for abdominal pain.  Musculoskeletal: Positive for arthralgias (R shoulder ) and back pain.  Neurological: Negative for weakness and numbness.       Negative for incontinence to bowel/bladder. Negative for saddle anesthesia.    Physical Exam Updated Vital Signs BP (!) 151/78 (BP Location: Right Arm)   Pulse (!) 102   Temp 98.3 F (36.8 C) (Oral)   Resp (!) 24   SpO2 99%   Physical Exam  Constitutional: She appears well-developed and well-nourished. She appears distressed (mild secondary to pain).  HENT:  Head: Normocephalic and atraumatic.  Eyes: Conjunctivae are normal. Right eye exhibits no discharge. Left eye exhibits no discharge.  Cardiovascular: Regular rhythm. Tachycardia present.  Pulses:      Radial pulses are 2+ on the right side, and 2+ on the left side.  Pulmonary/Chest: Effort normal and breath sounds normal.  Musculoskeletal: She exhibits no edema.  No obvious deformities, appreciable swelling, erythema, ecchymosis, or warmth.  Upper Extremities: Patient has full ROM at all joints with exception of R shoulder flexion/abducion minimally limited secondary to pain. R shoulder is diffusely tender to palpation, no focal tenderness.  Back: Patient has diffuse midline thoracic tenderness to palpation which extends to R paraspinal muscles of thoracic and lumbar region specifically tender over the R trapezius muscles.   Neurological: She is alert.   5/5 grip strength bilaterally/ 5/5 strength with plantar/dorsiflexion bilaterally. Sensation grossly intact to bilateral upper/lower extremities. Gait antalgic, but intact.   Psychiatric: She has a normal  mood and affect. Her behavior is normal. Thought content normal.  Nursing note and vitals reviewed.  ED Treatments / Results  Labs (all labs ordered are listed, but only abnormal results are displayed) Labs Reviewed - No data to display  EKG  EKG Interpretation None      Radiology Dg Thoracic Spine 2 View  Result Date: 07/13/2017 CLINICAL DATA:  Patient began having pains in her upper back and right shoulder today approximately 10 AM. Was pushing a shopping cart at the time. In 2010 she had a pallet fall onto her right shoulder at work but no fracture was found, diagnosed right shoulder sprain. Pain on posterior right shoulder, sensitive to touch as well. Able to lift and move arm relatively well. EXAM: THORACIC SPINE 2 VIEWS COMPARISON:  Chest radiographs, 06/15/2017 FINDINGS: No fracture or spondylolisthesis.  No bone lesion. There are small endplate osteophytes along the mid to lower thoracic spine primarily seen along the right anterior aspect. No other degenerative change. There are bowel anastomosis staples in the  upper abdomen consistent with previous gastric surgery. IMPRESSION: 1. No fracture, bone lesion or spondylolisthesis. 2. Mild disc degenerative change. Electronically Signed   By: Lajean Manes M.D.   On: 07/13/2017 15:15   Dg Shoulder Right  Result Date: 07/13/2017 CLINICAL DATA:  Patient began having pains in her upper back and right shoulder today approximately 10 AM. Was pushing a shopping cart at the time. In 2010 she had a pallet fall onto her right shoulder at work but no fracture was found, diagnosed right shoulder sprain. Pain on posterior right shoulder, sensitive to touch as well. Able to lift and move arm relatively well. EXAM: RIGHT SHOULDER - 2+ VIEW COMPARISON:  None. FINDINGS: No fracture.  No bone lesion. The glenohumeral AC joints are normally spaced and aligned. No significant degenerative change. Soft tissues are unremarkable. IMPRESSION: Negative.  Electronically Signed   By: Lajean Manes M.D.   On: 07/13/2017 15:13    Procedures Procedures (including critical care time)  Medications Ordered in ED Medications  naproxen (NAPROSYN) tablet 500 mg (not administered)    Initial Impression / Assessment and Plan / ED Course  I have reviewed the triage vital signs and the nursing notes.  Pertinent labs & imaging results that were available during my care of the patient were reviewed by me and considered in my medical decision making (see chart for details).   Patient presents with R sided back pain and R shoulder pain. She is nontoxic appearing, appears somewhat uncomfortable secondary to pain, noted to be tachycardic and somewhat hypertensive- possible secondary to pain. On exam patient is diffusely tender to the R shoulder, thoracic/lumbar paraspinal muscles, as well as diffuse midline thoracic region. X-rays ordered of R shoulder and thoracic spine- negative for acute abnormality. No neurologic deficits or point vertebral tenderness.  Patient able to ambulate. NVI distally.  No loss of bowel or bladder control. No saddle anesthesia.  No concern for cauda equina.  No fever, night sweats, weight loss, h/o cancer, or IVDU. Patient without chest pain, dyspnea, leg pain/swelling, hemoptysis, recent surgery/trauma, recent long travel, hormone use,  or hx of DVT/PE, pain is reproducible with movement and palpation- doubt pulmonary embolism. Will treat with Naproxen and Robaxin, discussed with patient that they are not to drive or operate heavy machinery while taking Robaxin. I discussed treatment plan, need for PCP follow-up, and return precautions with the patient. Provided opportunity for questions, patient confirmed understanding and is in agreement with plan.   Final Clinical Impressions(s) / ED Diagnoses   Final diagnoses:  Acute right-sided back pain, unspecified back location    ED Discharge Orders        Ordered    naproxen (NAPROSYN)  375 MG tablet  2 times daily with meals     07/13/17 1540    methocarbamol (ROBAXIN) 500 MG tablet  Every 8 hours PRN     07/13/17 1540       Jakiya Bookbinder, [place] R, PA-C 07/13/17 1702    Virgel Manifold, MD 07/14/17 [id number]

## 2017-07-19 ENCOUNTER — Other Ambulatory Visit: Payer: Self-pay

## 2017-07-19 ENCOUNTER — Emergency Department (HOSPITAL_COMMUNITY): Payer: Medicare Other

## 2017-07-19 ENCOUNTER — Emergency Department (HOSPITAL_COMMUNITY)
Admission: EM | Admit: 2017-07-19 | Discharge: 2017-07-20 | Disposition: A | Discharge status: Home / Self Care | Payer: Medicare Other | Attending: Emergency Medicine | Admitting: Emergency Medicine

## 2017-07-19 ENCOUNTER — Encounter (HOSPITAL_COMMUNITY): Payer: Self-pay | Admitting: Emergency Medicine

## 2017-07-19 DIAGNOSIS — L03011 Cellulitis of right finger: Secondary | ICD-10-CM | POA: Diagnosis not present

## 2017-07-19 DIAGNOSIS — J45909 Unspecified asthma, uncomplicated: Secondary | ICD-10-CM | POA: Diagnosis not present

## 2017-07-19 DIAGNOSIS — Z9884 Bariatric surgery status: Secondary | ICD-10-CM | POA: Insufficient documentation

## 2017-07-19 DIAGNOSIS — I10 Essential (primary) hypertension: Secondary | ICD-10-CM | POA: Diagnosis not present

## 2017-07-19 DIAGNOSIS — Z9049 Acquired absence of other specified parts of digestive tract: Secondary | ICD-10-CM | POA: Insufficient documentation

## 2017-07-19 DIAGNOSIS — Z79899 Other long term (current) drug therapy: Secondary | ICD-10-CM | POA: Diagnosis not present

## 2017-07-19 DIAGNOSIS — Z8673 Personal history of transient ischemic attack (TIA), and cerebral infarction without residual deficits: Secondary | ICD-10-CM | POA: Diagnosis not present

## 2017-07-19 DIAGNOSIS — R2231 Localized swelling, mass and lump, right upper limb: Secondary | ICD-10-CM | POA: Diagnosis present

## 2017-07-19 DIAGNOSIS — F329 Major depressive disorder, single episode, unspecified: Secondary | ICD-10-CM | POA: Diagnosis not present

## 2017-07-19 DIAGNOSIS — H913 Deaf nonspeaking, not elsewhere classified: Secondary | ICD-10-CM | POA: Insufficient documentation

## 2017-07-19 DIAGNOSIS — M7989 Other specified soft tissue disorders: Secondary | ICD-10-CM | POA: Diagnosis not present

## 2017-07-19 MED ORDER — SULFAMETHOXAZOLE-TRIMETHOPRIM 800-160 MG PO TABS: 1.0000 | ORAL_TABLET | Freq: Two times a day (BID) | ORAL | 0 refills | Status: AC

## 2017-07-19 MED ORDER — LIDOCAINE HCL 2 % IJ SOLN
20.0000 mL | Freq: Once | INTRAMUSCULAR | Status: AC
  Administered 2017-07-19: 400 mg
  Filled 2017-07-19: qty 20

## 2017-07-19 NOTE — ED Notes (Signed)
RN assisted PA in I&D procedure of left middle finger-Monique,RN

## 2017-07-19 NOTE — Discharge Instructions (Signed)
It was my pleasure taking care of you today!   Please take all of your antibiotics until finished!   Wash the area multiple times throughout the day.   Return to ER for new or worsening symptoms, any additional concerns.

## 2017-07-19 NOTE — ED Notes (Signed)
ED Provider at bedside. 

## 2017-07-19 NOTE — ED Triage Notes (Signed)
The patient says she had artificial nails three weeks ago and when they fell off the one finger she noticed was inflamed.  The patient rates 10/10.  The patient has taken advil but it is not working.  Her daughter tried to drain it with a pin and it did not work.

## 2017-07-20 NOTE — ED Provider Notes (Signed)
[facility] EMERGENCY DEPARTMENT Provider Note   CSN: [id number] Arrival date & time: 07/19/17  2154     History   Chief Complaint Chief Complaint  Patient presents with  . Finger Injury    HPI Stacie Wolfe is a 52 y.o. female.  The history is provided by the patient and medical records. A language interpreter was used (ASL video interpreter).   Stacie Wolfe is a 52 y.o. female  with a PMH as listed below who presents to the Emergency Department complaining of pain to right middle finger over the last 2 days.  She had acrylic nails placed about 3 weeks ago.  She noticed irritation about 3 days ago when the nail fell off.  Over the last 2 days, she has had significantly worsening pain and swelling around the nailbed.  She has tried taking ibuprofen with no improvement.  Her daughter tried to prick the area with a pin to treat it, but this did not help either.  No fever or chills.  No history of similar.   Past Medical History:  Diagnosis Date  . Asthma   . Deaf   . Depression   . Hypertension   . Migraine   . Stroke [facility])     There are no active problems to display for this patient.   Past Surgical History:  Procedure Laterality Date  . ABDOMINAL SURGERY    . CHOLECYSTECTOMY    . COSMETIC SURGERY    . GASTRIC BYPASS    . TUBAL LIGATION      OB History    No data available       Home Medications    Prior to Admission medications   Medication Sig Start Date End Date Taking? Authorizing Provider  acetaminophen-codeine 120-12 MG/5ML solution Take 10 mLs by mouth every 4 (four) hours as needed for moderate pain. 04/06/17   Lawyer, Harrell Gave, PA-C  ibuprofen (ADVIL,MOTRIN) 800 MG tablet Take 1 tablet (800 mg total) by mouth every 8 (eight) hours as needed. Patient taking differently: Take 800 mg by mouth every 8 (eight) hours as needed for fever or headache (pain).  04/06/17   Lawyer, Harrell Gave, PA-C  methocarbamol (ROBAXIN) 500 MG  tablet Take 1 tablet (500 mg total) by mouth every 8 (eight) hours as needed for muscle spasms. 07/13/17   Petrucelli, Samantha R, PA-C  naproxen (NAPROSYN) 375 MG tablet Take 1 tablet (375 mg total) by mouth 2 (two) times daily with a meal. 07/13/17   Petrucelli, Samantha R, PA-C  ondansetron (ZOFRAN ODT) 4 MG disintegrating tablet Take 1 tablet (4 mg total) by mouth every 8 (eight) hours as needed for nausea or vomiting. 06/16/17   Duffy Bruce, MD  sulfamethoxazole-trimethoprim (BACTRIM DS,SEPTRA DS) 800-160 MG tablet Take 1 tablet by mouth 2 (two) times daily for 7 days. 07/19/17 07/26/17  Ward, Ozella Almond, PA-C    Family History History reviewed. No pertinent family history.  Social History Social History   Tobacco Use  . Smoking status: Never Smoker  . Smokeless tobacco: Never Used  Substance Use Topics  . Alcohol use: Yes    Comment: weekends   . Drug use: No     Allergies   Verapamil   Review of Systems Review of Systems  Constitutional: Negative for chills and fever.  Musculoskeletal: Positive for myalgias.  Skin: Positive for wound.  Neurological: Negative for weakness and numbness.     Physical Exam Updated Vital Signs BP (!) 190/92 (BP Location: Left  Arm)   Pulse 82   Temp 98.4 F (36.9 C) (Oral)   Resp 18   SpO2 100%   Physical Exam  Constitutional: She appears well-developed and well-nourished. No distress.  HENT:  Head: Normocephalic and atraumatic.  Neck: Neck supple.  Cardiovascular: Normal rate, regular rhythm and normal heart sounds.  No murmur heard. Pulmonary/Chest: Effort normal and breath sounds normal. No respiratory distress. She has no wheezes. She has no rales.  Musculoskeletal: Normal range of motion.  Neurological: She is alert.  Skin: Skin is warm and dry.  Area of fluctuance consistent with paronychia to the right middle finger distal nail bed.  No tenderness to pad of the finger to suggest felon. Good cap refill. Sensation intact.    Nursing note and vitals reviewed.    ED Treatments / Results  Labs (all labs ordered are listed, but only abnormal results are displayed) Labs Reviewed - No data to display  EKG  EKG Interpretation None       Radiology Dg Finger Middle Right  Result Date: 07/19/2017 CLINICAL DATA:  Inflamed nail bed right middle finger EXAM: RIGHT MIDDLE FINGER 2+V COMPARISON:  None. FINDINGS: No subluxation. Diffuse soft tissue swelling of the third digit. Ossific fragment adjacent to the head of the third middle phalanx. No soft tissue gas. IMPRESSION: 1. Ossific fragment adjacent to the head of the third middle phalanx, may relate to age indeterminate fracture 2. Diffuse soft tissue swelling without soft tissue gas, periostitis or bone destruction. Electronically Signed   By: Donavan Foil M.D.   On: 07/19/2017 22:42    Procedures .Nerve Block Date/Time: 07/20/2017 12:09 AM Performed by: Ward, Ozella Almond, PA-C Authorized by: Ward, Ozella Almond, PA-C   Consent:    Consent obtained:  Verbal (With translator)   Consent given by:  Patient   Risks discussed:  Bleeding, infection, pain and unsuccessful block Indications:    Indications:  Procedural anesthesia Location:    Body area:  Upper extremity   Upper extremity nerve blocked: Digital; Right middle finger.   Laterality:  Right Pre-procedure details:    Skin preparation:  Povidone-iodine Procedure details (see MAR for exact dosages):    Block needle gauge:  25 G   Anesthetic injected:  Lidocaine 2% w/o epi   Injection procedure:  Anatomic landmarks identified Post-procedure details:    Outcome:  Anesthesia achieved   Patient tolerance of procedure:  Tolerated well, no immediate complications .Marland KitchenIncision and Drainage Date/Time: 07/20/2017 12:11 AM Performed by: Ward, Ozella Almond, PA-C Authorized by: Ward, Ozella Almond, PA-C   Consent:    Consent obtained:  Verbal (With translator)   Consent given by:  Patient   Risks  discussed:  Bleeding, incomplete drainage, pain and infection Location:    Type:  Abscess (Paronychia)   Location:  Upper extremity   Upper extremity location:  Finger   Finger location:  R long finger Pre-procedure details:    Skin preparation:  Betadine Anesthesia (see MAR for exact dosages):    Anesthesia method:  Nerve block Procedure type:    Complexity:  Simple Procedure details:    Needle aspiration: no     Incision types:  Single straight   Scalpel blade:  11   Wound management:  Irrigated with saline and probed and deloculated   Drainage:  Purulent   Drainage amount:  Moderate   Wound treatment:  Wound left open   Packing materials:  None Post-procedure details:    Patient tolerance of procedure:  Tolerated well,  no immediate complications   (including critical care time)  Medications Ordered in ED Medications  lidocaine (XYLOCAINE) 2 % (with pres) injection 400 mg (400 mg Infiltration Given by Other 07/19/17 2321)     Initial Impression / Assessment and Plan / ED Course  I have reviewed the triage vital signs and the nursing notes.  Pertinent labs & imaging results that were available during my care of the patient were reviewed by me and considered in my medical decision making (see chart for details).    Stacie Wolfe is a 52 y.o. female who presents to ED for paronychia to the middle finger after having acrylic nails placed.  Neurovascularly intact.  No tenderness to the pad of the finger to suggest felon.  Digital block performed and paronychia drained without complication as dictated above.  Will place on antibiotics and have patient follow-up with PCP.  Follow-up care, home care instructions and reasons to return to ER were discussed using ASL sign language interpreter and all questions were answered.  Final Clinical Impressions(s) / ED Diagnoses   Final diagnoses:  Paronychia, finger, right    ED Discharge Orders        Ordered     sulfamethoxazole-trimethoprim (BACTRIM DS,SEPTRA DS) 800-160 MG tablet  2 times daily     07/19/17 2332       Ward, Ozella Almond, PA-C 07/20/17 0013    Gareth Morgan, MD 07/20/17 1416

## 2017-07-24 ENCOUNTER — Inpatient Hospital Stay: Payer: Self-pay

## 2017-09-01 ENCOUNTER — Ambulatory Visit: Payer: Medicare Other | Attending: Family Medicine | Admitting: Family Medicine

## 2017-09-01 ENCOUNTER — Encounter: Payer: Self-pay | Admitting: Family Medicine

## 2017-09-01 VITALS — BP 155/88 | HR 76 | Temp 98.4°F | Ht 61.0 in | Wt 184.2 lb

## 2017-09-01 DIAGNOSIS — Z9049 Acquired absence of other specified parts of digestive tract: Secondary | ICD-10-CM | POA: Insufficient documentation

## 2017-09-01 DIAGNOSIS — R202 Paresthesia of skin: Secondary | ICD-10-CM

## 2017-09-01 DIAGNOSIS — I10 Essential (primary) hypertension: Secondary | ICD-10-CM | POA: Diagnosis not present

## 2017-09-01 DIAGNOSIS — N951 Menopausal and female climacteric states: Secondary | ICD-10-CM | POA: Diagnosis not present

## 2017-09-01 DIAGNOSIS — Z9884 Bariatric surgery status: Secondary | ICD-10-CM | POA: Diagnosis not present

## 2017-09-01 DIAGNOSIS — J4520 Mild intermittent asthma, uncomplicated: Secondary | ICD-10-CM | POA: Diagnosis not present

## 2017-09-01 DIAGNOSIS — F329 Major depressive disorder, single episode, unspecified: Secondary | ICD-10-CM | POA: Diagnosis not present

## 2017-09-01 DIAGNOSIS — G4709 Other insomnia: Secondary | ICD-10-CM | POA: Diagnosis not present

## 2017-09-01 DIAGNOSIS — R232 Flushing: Secondary | ICD-10-CM

## 2017-09-01 DIAGNOSIS — Z888 Allergy status to other drugs, medicaments and biological substances status: Secondary | ICD-10-CM | POA: Diagnosis not present

## 2017-09-01 DIAGNOSIS — R030 Elevated blood-pressure reading, without diagnosis of hypertension: Secondary | ICD-10-CM | POA: Diagnosis not present

## 2017-09-01 DIAGNOSIS — M25551 Pain in right hip: Secondary | ICD-10-CM | POA: Diagnosis not present

## 2017-09-01 DIAGNOSIS — Z8673 Personal history of transient ischemic attack (TIA), and cerebral infarction without residual deficits: Secondary | ICD-10-CM | POA: Diagnosis not present

## 2017-09-01 DIAGNOSIS — J45909 Unspecified asthma, uncomplicated: Secondary | ICD-10-CM | POA: Insufficient documentation

## 2017-09-01 DIAGNOSIS — Z79899 Other long term (current) drug therapy: Secondary | ICD-10-CM | POA: Insufficient documentation

## 2017-09-01 DIAGNOSIS — H9193 Unspecified hearing loss, bilateral: Secondary | ICD-10-CM | POA: Insufficient documentation

## 2017-09-01 MED ORDER — ALBUTEROL SULFATE HFA 108 (90 BASE) MCG/ACT IN AERS: 2.0000 | INHALATION_SPRAY | Freq: Four times a day (QID) | RESPIRATORY_TRACT | 2 refills | Status: DC | PRN

## 2017-09-01 MED ORDER — TRAMADOL HCL 50 MG PO TABS: 50.0000 mg | ORAL_TABLET | Freq: Two times a day (BID) | ORAL | 0 refills | Status: DC | PRN

## 2017-09-01 MED ORDER — METHOCARBAMOL 500 MG PO TABS: 500.0000 mg | ORAL_TABLET | Freq: Three times a day (TID) | ORAL | 1 refills | Status: DC | PRN

## 2017-09-01 MED ORDER — GABAPENTIN 300 MG PO CAPS: 300.0000 mg | ORAL_CAPSULE | Freq: Two times a day (BID) | ORAL | 1 refills | Status: DC

## 2017-09-01 MED ORDER — CLONIDINE HCL 0.1 MG PO TABS: 0.1000 mg | ORAL_TABLET | Freq: Every day | ORAL | 3 refills | Status: DC

## 2017-09-01 MED FILL — traMADol HCL 50 MG TABS: 50 | 5 days supply | Qty: 10 | Fill #0

## 2017-09-01 MED FILL — VENTOLIN HFA 90 MCG INHALER: 108 (90 BAS | 25 days supply | Qty: 18 | Fill #0

## 2017-09-01 MED FILL — GABAPENTIN 300 MG CAPSULE: 300 | 30 days supply | Qty: 60 | Fill #0

## 2017-09-01 MED FILL — cloNIDine HCL 0.1 MG TABS: 0.1 | 30 days supply | Qty: 30 | Fill #0

## 2017-09-01 MED FILL — METHOCARBAMOL 500 MG TABS: 500 | 30 days supply | Qty: 90 | Fill #0

## 2017-09-01 NOTE — Patient Instructions (Signed)
Menopause Menopause is the normal time of life when menstrual periods stop completely. Menopause is complete when you have missed 12 consecutive menstrual periods. It usually occurs between the ages of 48 years and 55 years. Very rarely does a woman develop menopause before the age of 40 years. At menopause, your ovaries stop producing the female hormones estrogen and progesterone. This can cause undesirable symptoms and also affect your health. Sometimes the symptoms may occur 4-5 years before the menopause begins. There is no relationship between menopause and:  Oral contraceptives.  Number of children you had.  Race.  The age your menstrual periods started (menarche).  Heavy smokers and very thin women may develop menopause earlier in life. What are the causes?  The ovaries stop producing the female hormones estrogen and progesterone. Other causes include:  Surgery to remove both ovaries.  The ovaries stop functioning for no known reason.  Tumors of the pituitary gland in the brain.  Medical disease that affects the ovaries and hormone production.  Radiation treatment to the abdomen or pelvis.  Chemotherapy that affects the ovaries.  What are the signs or symptoms?  Hot flashes.  Night sweats.  Decrease in sex drive.  Vaginal dryness and thinning of the vagina causing painful intercourse.  Dryness of the skin and developing wrinkles.  Headaches.  Tiredness.  Irritability.  Memory problems.  Weight gain.  Bladder infections.  Hair growth of the face and chest.  Infertility. More serious symptoms include:  Loss of bone (osteoporosis) causing breaks (fractures).  Depression.  Hardening and narrowing of the arteries (atherosclerosis) causing heart attacks and strokes.  How is this diagnosed?  When the menstrual periods have stopped for 12 straight months.  Physical exam.  Hormone studies of the blood. How is this treated? There are many treatment  choices and nearly as many questions about them. The decisions to treat or not to treat menopausal changes is an individual choice made with your health care provider. Your health care provider can discuss the treatments with you. Together, you can decide which treatment will work best for you. Your treatment choices may include:  Hormone therapy (estrogen and progesterone).  Non-hormonal medicines.  Treating the individual symptoms with medicine (for example antidepressants for depression).  Herbal medicines that may help specific symptoms.  Counseling by a psychiatrist or psychologist.  Group therapy.  Lifestyle changes including: ? Eating healthy. ? Regular exercise. ? Limiting caffeine and alcohol. ? Stress management and meditation.  No treatment.  Follow these instructions at home:  Take the medicine your health care provider gives you as directed.  Get plenty of sleep and rest.  Exercise regularly.  Eat a diet that contains calcium (good for the bones) and soy products (acts like estrogen hormone).  Avoid alcoholic beverages.  Do not smoke.  If you have hot flashes, dress in layers.  Take supplements, calcium, and vitamin D to strengthen bones.  You can use over-the-counter lubricants or moisturizers for vaginal dryness.  Group therapy is sometimes very helpful.  Acupuncture may be helpful in some cases. Contact a health care provider if:  You are not sure you are in menopause.  You are having menopausal symptoms and need advice and treatment.  You are still having menstrual periods after age 55 years.  You have pain with intercourse.  Menopause is complete (no menstrual period for 12 months) and you develop vaginal bleeding.  You need a referral to a specialist (gynecologist, psychiatrist, or psychologist) for treatment. Get help right   away if:  You have severe depression.  You have excessive vaginal bleeding.  You fell and think you have a  broken bone.  You have pain when you urinate.  You develop leg or chest pain.  You have a fast pounding heart beat (palpitations).  You have severe headaches.  You develop vision problems.  You feel a lump in your breast.  You have abdominal pain or severe indigestion. This information is not intended to replace advice given to you by your health care provider. Make sure you discuss any questions you have with your health care provider. Document Released: 07/20/2003 Document Revised: 10/05/2015 Document Reviewed: 11/26/2012 Elsevier Interactive Patient Education  2017 Elsevier Inc.  
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
## 2017-10-08 ENCOUNTER — Ambulatory Visit: Payer: Medicare Other | Attending: Family Medicine | Admitting: Family Medicine

## 2017-10-08 ENCOUNTER — Telehealth: Payer: Self-pay

## 2017-10-08 ENCOUNTER — Other Ambulatory Visit: Payer: Self-pay

## 2017-10-08 ENCOUNTER — Encounter: Payer: Self-pay | Admitting: Family Medicine

## 2017-10-08 VITALS — BP 160/79 | HR 79 | Temp 97.9°F | Ht 61.0 in | Wt 181.8 lb

## 2017-10-08 DIAGNOSIS — R0789 Other chest pain: Secondary | ICD-10-CM | POA: Diagnosis not present

## 2017-10-08 DIAGNOSIS — M25551 Pain in right hip: Secondary | ICD-10-CM | POA: Insufficient documentation

## 2017-10-08 DIAGNOSIS — Z8673 Personal history of transient ischemic attack (TIA), and cerebral infarction without residual deficits: Secondary | ICD-10-CM | POA: Insufficient documentation

## 2017-10-08 DIAGNOSIS — Z79899 Other long term (current) drug therapy: Secondary | ICD-10-CM | POA: Diagnosis not present

## 2017-10-08 DIAGNOSIS — I10 Essential (primary) hypertension: Secondary | ICD-10-CM | POA: Insufficient documentation

## 2017-10-08 DIAGNOSIS — J45909 Unspecified asthma, uncomplicated: Secondary | ICD-10-CM | POA: Diagnosis not present

## 2017-10-08 DIAGNOSIS — M940 Chondrocostal junction syndrome [Tietze]: Secondary | ICD-10-CM

## 2017-10-08 DIAGNOSIS — W19XXXA Unspecified fall, initial encounter: Secondary | ICD-10-CM | POA: Diagnosis not present

## 2017-10-08 DIAGNOSIS — H9193 Unspecified hearing loss, bilateral: Secondary | ICD-10-CM | POA: Diagnosis not present

## 2017-10-08 DIAGNOSIS — F329 Major depressive disorder, single episode, unspecified: Secondary | ICD-10-CM | POA: Insufficient documentation

## 2017-10-08 DIAGNOSIS — M549 Dorsalgia, unspecified: Secondary | ICD-10-CM | POA: Diagnosis not present

## 2017-10-08 MED ORDER — LISINOPRIL 5 MG PO TABS: 5.0000 mg | ORAL_TABLET | Freq: Every day | ORAL | 3 refills | Status: DC

## 2017-10-08 MED ORDER — TRAMADOL HCL 50 MG PO TABS: 50.0000 mg | ORAL_TABLET | Freq: Two times a day (BID) | ORAL | 0 refills | Status: DC | PRN

## 2017-10-08 MED ORDER — CYCLOBENZAPRINE HCL 10 MG PO TABS: 10.0000 mg | ORAL_TABLET | Freq: Two times a day (BID) | ORAL | 1 refills | Status: DC | PRN

## 2017-10-08 MED ORDER — MELOXICAM 7.5 MG PO TABS
7.5000 mg | ORAL_TABLET | Freq: Every day | ORAL | 1 refills | Status: DC
Start: 2017-10-08 — End: 2018-03-03

## 2017-10-08 NOTE — Telephone Encounter (Signed)
Met with the patient when she was in the clinic today and assisted her with completing a [employer] application.  Rachyl, ASL interpreter was present.  The completed application was faxed to [employer] eligibility.

## 2017-10-08 NOTE — Patient Instructions (Signed)
Costochondritis Costochondritis is swelling and irritation (inflammation) of the tissue (cartilage) that connects your ribs to your breastbone (sternum). This causes pain in the front of your chest. Usually, the pain:  Starts gradually.  Is in more than one rib.  This condition usually goes away on its own over time. Follow these instructions at home:  Do not do anything that makes your pain worse.  If directed, put ice on the painful area: ? Put ice in a plastic bag. ? Place a towel between your skin and the bag. ? Leave the ice on for 20 minutes, 2-3 times a day.  If directed, put heat on the affected area as often as told by your doctor. Use the heat source that your doctor tells you to use, such as a moist heat pack or a heating pad. ? Place a towel between your skin and the heat source. ? Leave the heat on for 20-30 minutes. ? Take off the heat if your skin turns bright red. This is very important if you cannot feel pain, heat, or cold. You may have a greater risk of getting burned.  Take over-the-counter and prescription medicines only as told by your doctor.  Return to your normal activities as told by your doctor. Ask your doctor what activities are safe for you.  Keep all follow-up visits as told by your doctor. This is important. Contact a doctor if:  You have chills or a fever.  Your pain does not go away or it gets worse.  You have a cough that does not go away. Get help right away if:  You are short of breath. This information is not intended to replace advice given to you by your health care provider. Make sure you discuss any questions you have with your health care provider. Document Released: 10/16/2007 Document Revised: 11/17/2015 Document Reviewed: 08/23/2015 Elsevier Interactive Patient Education  2018 [employer]vier Inc.  
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
## 2017-10-24 ENCOUNTER — Telehealth: Payer: Self-pay | Admitting: Family Medicine

## 2017-10-24 NOTE — Telephone Encounter (Signed)
Call placed to patient [phone] regarding her SCAT application. Spoke with patient and she informed me that she hasn't received a call yet. Informed her that I would fax her application again.   Faxed SCAT application to (fax) # [phone]

## 2017-10-28 MED FILL — VENTOLIN HFA 90 MCG INHALER: 108 (90 BAS | 25 days supply | Qty: 18 | Fill #1

## 2017-10-28 MED FILL — cloNIDine HCL 0.1 MG TABS: 0.1 | 30 days supply | Qty: 30 | Fill #1

## 2017-10-28 MED FILL — GABAPENTIN 300 MG CAPSULE: 300 | 30 days supply | Qty: 60 | Fill #1

## 2017-10-28 MED FILL — LISINOPRIL 5 MG TAB: 5 | 30 days supply | Qty: 30 | Fill #0

## 2017-10-28 MED FILL — traMADol HCL 50 MG TABS: 50 | 15 days supply | Qty: 30 | Fill #0

## 2017-10-28 MED FILL — MELOXICAM 7.5 MG TABLET: 7.5 | 30 days supply | Qty: 30 | Fill #0

## 2017-10-28 MED FILL — CYCLOBENZAPRINE 10 MG TAB: 10 | 15 days supply | Qty: 30 | Fill #0

## 2017-12-01 ENCOUNTER — Other Ambulatory Visit (HOSPITAL_COMMUNITY)
Admission: RE | Admit: 2017-12-01 | Discharge: 2017-12-01 | Disposition: A | Discharge status: Home / Self Care | Payer: Medicare Other | Source: Ambulatory Visit | Attending: Family Medicine | Admitting: Family Medicine

## 2017-12-01 ENCOUNTER — Ambulatory Visit: Payer: Medicare Other | Attending: Family Medicine | Admitting: Family Medicine

## 2017-12-01 ENCOUNTER — Encounter: Payer: Self-pay | Admitting: Family Medicine

## 2017-12-01 VITALS — BP 146/88 | HR 83 | Temp 98.0°F | Ht 61.0 in | Wt 182.8 lb

## 2017-12-01 DIAGNOSIS — H9190 Unspecified hearing loss, unspecified ear: Secondary | ICD-10-CM | POA: Insufficient documentation

## 2017-12-01 DIAGNOSIS — B3731 Acute candidiasis of vulva and vagina: Secondary | ICD-10-CM

## 2017-12-01 DIAGNOSIS — J45909 Unspecified asthma, uncomplicated: Secondary | ICD-10-CM | POA: Insufficient documentation

## 2017-12-01 DIAGNOSIS — Z114 Encounter for screening for human immunodeficiency virus [HIV]: Secondary | ICD-10-CM

## 2017-12-01 DIAGNOSIS — N760 Acute vaginitis: Secondary | ICD-10-CM

## 2017-12-01 DIAGNOSIS — Z1211 Encounter for screening for malignant neoplasm of colon: Secondary | ICD-10-CM

## 2017-12-01 DIAGNOSIS — Z79899 Other long term (current) drug therapy: Secondary | ICD-10-CM | POA: Diagnosis not present

## 2017-12-01 DIAGNOSIS — Z124 Encounter for screening for malignant neoplasm of cervix: Secondary | ICD-10-CM | POA: Insufficient documentation

## 2017-12-01 DIAGNOSIS — F329 Major depressive disorder, single episode, unspecified: Secondary | ICD-10-CM | POA: Insufficient documentation

## 2017-12-01 DIAGNOSIS — R200 Anesthesia of skin: Secondary | ICD-10-CM | POA: Diagnosis not present

## 2017-12-01 DIAGNOSIS — Z8673 Personal history of transient ischemic attack (TIA), and cerebral infarction without residual deficits: Secondary | ICD-10-CM | POA: Insufficient documentation

## 2017-12-01 DIAGNOSIS — Z1239 Encounter for other screening for malignant neoplasm of breast: Secondary | ICD-10-CM

## 2017-12-01 DIAGNOSIS — B9689 Other specified bacterial agents as the cause of diseases classified elsewhere: Secondary | ICD-10-CM

## 2017-12-01 DIAGNOSIS — Z0000 Encounter for general adult medical examination without abnormal findings: Secondary | ICD-10-CM

## 2017-12-01 DIAGNOSIS — B373 Candidiasis of vulva and vagina: Secondary | ICD-10-CM

## 2017-12-01 DIAGNOSIS — Z1231 Encounter for screening mammogram for malignant neoplasm of breast: Secondary | ICD-10-CM | POA: Diagnosis not present

## 2017-12-01 DIAGNOSIS — I10 Essential (primary) hypertension: Secondary | ICD-10-CM | POA: Insufficient documentation

## 2017-12-01 MED ORDER — METRONIDAZOLE 0.75 % VA GEL: 1.0000 | Freq: Every day | VAGINAL | 0 refills | Status: DC

## 2017-12-01 MED ORDER — FLUCONAZOLE 150 MG PO TABS: 150.0000 mg | ORAL_TABLET | Freq: Once | ORAL | 0 refills | Status: AC

## 2017-12-01 NOTE — Patient Instructions (Signed)
  Stacie Wolfe , Thank you for taking time to come for your Medicare Wellness Visit. I appreciate your ongoing commitment to your health goals. Please review the following plan we discussed and let me know if I can assist you in the future.   These are the goals we discussed: Goals    None      This is a list of the screening recommended for you and due dates:  Health Maintenance  Topic Date Due  . HIV Screening  10/04/1980  . Pap Smear  10/05/1986  . Mammogram  10/05/2015  . Colon Cancer Screening  10/05/2015  . Flu Shot  12/11/2017  . Tetanus Vaccine  08/13/2026

## 2017-12-01 NOTE — Progress Notes (Signed)
Subjective:   Stacie Wolfe is a 52 y.o. female who presents for Medicare Annual (Subsequent) preventive examination. She is accompanied by an interpreter as she is hearing impaired She is due for Pap smear, mammogram and colonoscopy.  Complains of numbness in her left leg which she has noticed for the last 3 days and has occurred while walking, while crossing her legs and almost feels like a 'stroke' and symptoms resolved spontaneously.  She denies the presence of similar symptoms in other body parts.  Review of Systems:  General: negative for fever, weight loss, appetite change Eyes: no visual symptoms. ENT: hearing impaired, no sinus tenderness, no nasal congestion or sore throat. Neck: no pain  Respiratory: no wheezing, shortness of breath, cough Cardiovascular: no chest pain, no dyspnea on exertion, no pedal edema, no orthopnea. Gastrointestinal: no abdominal pain, no diarrhea, no constipation Genito-Urinary: no urinary frequency, no dysuria, no polyuria. Hematologic: no bruising Endocrine: no cold or heat intolerance Neurological: + paresthesia ,no headaches, no seizures, no tremors Musculoskeletal: no joint pains, no joint swelling Skin: no pruritus, no rash. Psychological: no depression, no anxiety,          Objective:     Vitals: BP (!) 146/88   Pulse 83   Temp 98 F (36.7 C) (Oral)   Ht 5\' 1"  (1.549 m)   Wt 182 lb 12.8 oz (82.9 kg)   SpO2 99%   BMI 34.54 kg/m   Body mass index is 34.54 kg/m.   Constitutional: normal appearing,  Eyes: PERRLA HEENT: Head is atraumatic, normal sinuses, normal oropharynx, normal appearing tonsils and palate, tympanic membrane is normal bilaterally. Neck: normal range of motion, no thyromegaly, no JVD Breast: no TTP , no lumps Cardiovascular: normal rate and rhythm, normal heart sounds, no murmurs, rub or gallop, no pedal edema Respiratory: clear to auscultation bilaterally, no wheezes, no rales, no rhonchi Abdomen: soft,  not tender to palpation, normal bowel sounds, no enlarged organs Extremities: Full ROM, no tenderness in joints Skin: warm and dry, no lesions. Genitourinary: Normal appearance of vulva, vagina with whitish discharge and fishy odor, normal cervix, normal adnexa Neurological: alert, oriented x3, cranial nerves I-XII grossly intact , normal motor strength, normal sensation. Psychological: normal mood.   Advanced Directives 12/01/2017 07/19/2017 06/15/2017 06/12/2017 12/29/2016 11/07/2016 08/12/2016  Does Patient Have a Medical Advance Directive? No No No No No No No  Would patient like information on creating a medical advance directive? Yes (MAU/Ambulatory/Procedural Areas - Information given) No - Patient declined - - - No - Patient declined No - Patient declined    Tobacco Social History   Tobacco Use  Smoking Status Never Smoker  Smokeless Tobacco Never Used     Counseling given: Not Answered   Clinical Intake:  Pre-visit preparation completed: Yes  Pain : No/denies pain     Diabetes: No     Interpreter Needed?: Yes Interpreter Agency:      Past Medical History:  Diagnosis Date  . Asthma   . Deaf   . Depression   . Hypertension   . Migraine   . Stroke [facility])    Past Surgical History:  Procedure Laterality Date  . ABDOMINAL SURGERY    . CHOLECYSTECTOMY    . COSMETIC SURGERY    . GASTRIC BYPASS    . TUBAL LIGATION     No family history on file. Social History   Socioeconomic History  . Marital status: Soil scientist    Spouse name: Not on file  .  Number of children: Not on file  . Years of education: Not on file  . Highest education level: Not on file  Occupational History  . Not on file  Social Needs  . Financial resource strain: Not on file  . Food insecurity:    Worry: Not on file    Inability: Not on file  . Transportation needs:    Medical: Not on file    Non-medical: Not on file  Tobacco Use  . Smoking status: Never Smoker  .  Smokeless tobacco: Never Used  Substance and Sexual Activity  . Alcohol use: Yes    Comment: weekends   . Drug use: No  . Sexual activity: Yes    Birth control/protection: None  Lifestyle  . Physical activity:    Days per week: Not on file    Minutes per session: Not on file  . Stress: Not on file  Relationships  . Social connections:    Talks on phone: Not on file    Gets together: Not on file    Attends religious service: Not on file    Active member of club or organization: Not on file    Attends meetings of clubs or organizations: Not on file    Relationship status: Not on file  Other Topics Concern  . Not on file  Social History Narrative   ** Merged History Encounter **        Outpatient Encounter Medications as of 12/01/2017  Medication Sig  . albuterol (PROVENTIL HFA;VENTOLIN HFA) 108 (90 Base) MCG/ACT inhaler Inhale 2 puffs into the lungs every 6 (six) hours as needed for wheezing or shortness of breath.  . cloNIDine (CATAPRES) 0.1 MG tablet Take 1 tablet (0.1 mg total) by mouth at bedtime. For hot flashes  . cyclobenzaprine (FLEXERIL) 10 MG tablet Take 1 tablet (10 mg total) by mouth 2 (two) times daily as needed for muscle spasms.  Marland Kitchen gabapentin (NEURONTIN) 300 MG capsule Take 1 capsule (300 mg total) by mouth 2 (two) times daily.  Marland Kitchen lisinopril (PRINIVIL,ZESTRIL) 5 MG tablet Take 1 tablet (5 mg total) by mouth daily.  . meloxicam (MOBIC) 7.5 MG tablet Take 1 tablet (7.5 mg total) by mouth daily.  . ondansetron (ZOFRAN ODT) 4 MG disintegrating tablet Take 1 tablet (4 mg total) by mouth every 8 (eight) hours as needed for nausea or vomiting.  . traMADol (ULTRAM) 50 MG tablet Take 1 tablet (50 mg total) by mouth every 12 (twelve) hours as needed.  . fluconazole (DIFLUCAN) 150 MG tablet Take 1 tablet (150 mg total) by mouth once for 1 dose.  . metroNIDAZOLE (METROGEL VAGINAL) 0.75 % vaginal gel Place 1 Applicatorful vaginally at bedtime.   No facility-administered  encounter medications on file as of 12/01/2017.     Activities of Daily Living No flowsheet data found.  Patient Care Team: Charlott Rakes, MD as PCP - General (Family Medicine) Tawny Asal (Physician Assistant)    Assessment:   This is a routine wellness examination for Ira.  Exercise Activities and Dietary recommendations    Goals    None      Fall Risk Fall Risk  12/01/2017 10/08/2017 09/01/2017  Falls in the past year? No No Yes  Number falls in past yr: - - 1  Injury with Fall? - - No   Is the patient's home free of loose throw rugs in walkways, pet beds, electrical cords, etc?   yes      Grab bars in the  bathroom? no      Handrails on the stairs?   yes      Adequate lighting?   yes  Timed Get Up and Go performed: yes  Depression Screen PHQ 2/9 Scores 12/01/2017 10/08/2017  PHQ - 2 Score 4 2  PHQ- 9 Score 9 9     Cognitive Function        Immunization History  Administered Date(s) Administered  . Tdap 08/12/2016    Qualifies for Shingles Vaccine?yes  Screening Tests Health Maintenance  Topic Date Due  . HIV Screening  10/04/1980  . PAP SMEAR  10/05/1986  . MAMMOGRAM  10/05/2015  . COLONOSCOPY  10/05/2015  . INFLUENZA VACCINE  12/11/2017  . TETANUS/TDAP  08/13/2026    Cancer Screenings: Lung: Low Dose CT Chest recommended if Age 12-80 years, 30 pack-year currently smoking OR have quit w/in 15years. Patient does not qualify. Breast:  Up to date on Mammogram? No   Up to date of Bone Density/Dexa? N/a Colorectal: Referred for colonoscopy  Additional Screenings: ordered: Hepatitis C Screening:      Plan:   52 year old female, hearing impaired with a history of hypertension who presents today for an annual wellness exam  and complains of paresthesia. Appropriate screening tests ordered including; -Screening for breast cancer-mammogram -Screening for cervical cancer: Pap smear -Screening for colon cancer: Referred to GI for  colonoscopy -Bacterial vaginosis and candidiasis: Treated with Diflucan and MetroGel -Essential hypertension: Controlled, continue medications -Numbness: We will screen for diabetes with A1c -Screening for HIV: HIV test ordered  I have personally reviewed and noted the following in the patient's chart:   . Medical and social history . Use of alcohol, tobacco or illicit drugs  . Current medications and supplements . Functional ability and status . Nutritional status . Physical activity . Advanced directives . List of other physicians . Hospitalizations, surgeries, and ER visits in previous 12 months . Vitals . Screenings to include cognitive, depression, and falls . Referrals and appointments  In addition, I have reviewed and discussed with patient certain preventive protocols, quality metrics, and best practice recommendations. A written personalized care plan for preventive services as well as general preventive health recommendations were provided to patient.     Charlott Rakes, MD  12/01/2017

## 2017-12-03 LAB — CYTOLOGY - PAP
ADEQUACY: ABSENT
CHLAMYDIA, DNA PROBE: NEGATIVE
Candida vaginitis: POSITIVE — AB
DIAGNOSIS: NEGATIVE
HPV: NOT DETECTED
Neisseria Gonorrhea: NEGATIVE
Trichomonas: NEGATIVE

## 2017-12-04 LAB — CERVICOVAGINAL ANCILLARY ONLY: HERPES (WINDOWPATH): NEGATIVE

## 2017-12-16 MED FILL — VANDAZOLE VAGINAL 0.75% GEL: 0.75 | 5 days supply | Qty: 70 | Fill #0

## 2017-12-16 MED FILL — FLUCONAZOLE 150 MG TABS: 150 | 1 days supply | Qty: 1 | Fill #0

## 2017-12-18 DIAGNOSIS — H31092 Other chorioretinal scars, left eye: Secondary | ICD-10-CM | POA: Diagnosis not present

## 2017-12-18 DIAGNOSIS — H2513 Age-related nuclear cataract, bilateral: Secondary | ICD-10-CM | POA: Diagnosis not present

## 2017-12-23 MED FILL — VENTOLIN HFA 90 MCG INHALER: 108 (90 BAS | 25 days supply | Qty: 18 | Fill #2

## 2018-01-06 ENCOUNTER — Telehealth: Payer: Self-pay

## 2018-01-06 NOTE — Telephone Encounter (Signed)
Call received from [place] with [employer] who stated that the application has been received but the patient has not called for her appointment.

## 2018-01-13 ENCOUNTER — Encounter (HOSPITAL_COMMUNITY): Payer: Self-pay | Admitting: Emergency Medicine

## 2018-01-13 ENCOUNTER — Emergency Department (HOSPITAL_COMMUNITY): Payer: Medicare Other

## 2018-01-13 ENCOUNTER — Emergency Department (HOSPITAL_COMMUNITY)
Admission: EM | Admit: 2018-01-13 | Discharge: 2018-01-13 | Disposition: A | Discharge status: Home / Self Care | Payer: Medicare Other | Attending: Emergency Medicine | Admitting: Emergency Medicine

## 2018-01-13 DIAGNOSIS — R0689 Other abnormalities of breathing: Secondary | ICD-10-CM | POA: Diagnosis not present

## 2018-01-13 DIAGNOSIS — R079 Chest pain, unspecified: Secondary | ICD-10-CM | POA: Diagnosis not present

## 2018-01-13 DIAGNOSIS — J45909 Unspecified asthma, uncomplicated: Secondary | ICD-10-CM | POA: Diagnosis not present

## 2018-01-13 DIAGNOSIS — R457 State of emotional shock and stress, unspecified: Secondary | ICD-10-CM | POA: Diagnosis not present

## 2018-01-13 DIAGNOSIS — Z79899 Other long term (current) drug therapy: Secondary | ICD-10-CM | POA: Diagnosis not present

## 2018-01-13 DIAGNOSIS — F419 Anxiety disorder, unspecified: Secondary | ICD-10-CM | POA: Diagnosis not present

## 2018-01-13 DIAGNOSIS — I10 Essential (primary) hypertension: Secondary | ICD-10-CM | POA: Diagnosis not present

## 2018-01-13 LAB — CBC
HCT: 31.4 % — ABNORMAL LOW (ref 36.0–46.0)
Hemoglobin: 8.5 g/dL — ABNORMAL LOW (ref 12.0–15.0)
MCH: 17.4 pg — ABNORMAL LOW (ref 26.0–34.0)
MCHC: 27.1 g/dL — ABNORMAL LOW (ref 30.0–36.0)
MCV: 64.2 fL — AB (ref 78.0–100.0)
Platelets: 271 10*3/uL (ref 150–400)
RBC: 4.89 MIL/uL (ref 3.87–5.11)
RDW: 24.3 % — AB (ref 11.5–15.5)
WBC: 4 10*3/uL (ref 4.0–10.5)

## 2018-01-13 LAB — BASIC METABOLIC PANEL
Anion gap: 9 (ref 5–15)
BUN: 9 mg/dL (ref 6–20)
CHLORIDE: 108 mmol/L (ref 98–111)
CO2: 24 mmol/L (ref 22–32)
CREATININE: 0.5 mg/dL (ref 0.44–1.00)
Calcium: 9.9 mg/dL (ref 8.9–10.3)
GFR calc Af Amer: >60 mL/min (ref >60)
Glucose, Bld: 95 mg/dL (ref 70–99)
Potassium: 3.8 mmol/L (ref 3.5–5.1)
SODIUM: 141 mmol/L (ref 135–145)

## 2018-01-13 LAB — I-STAT TROPONIN, ED: TROPONIN I, POC: 0 ng/mL (ref 0.00–0.08)

## 2018-01-13 MED ORDER — LORAZEPAM 2 MG/ML IJ SOLN: 0.5000 mg | Freq: Once | INTRAMUSCULAR | Status: DC

## 2018-01-13 MED ORDER — HYDROXYZINE HCL 25 MG PO TABS
25.0000 mg | ORAL_TABLET | Freq: Once | ORAL | Status: AC
  Administered 2018-01-13: 25 mg via ORAL
  Filled 2018-01-13: qty 1

## 2018-01-13 MED ORDER — LORAZEPAM 1 MG PO TABS
1.0000 mg | ORAL_TABLET | Freq: Once | ORAL | Status: AC
  Administered 2018-01-13: 1 mg via ORAL
  Filled 2018-01-13: qty 1

## 2018-01-13 NOTE — ED Triage Notes (Signed)
Per [facility] pt from speech/deaf facility for anxiety/panic attack after court today and her attorney leaving and being accused of things she didn't do. Pt is deaf. Reports to drinking ETOH last night. Vitals: 120/78, 98hr, 100% on RA, 18R, CBG 86. Pt currently on phone signing to her husband. Pt gets worked up when starts talking about court.

## 2018-01-13 NOTE — ED Provider Notes (Signed)
[place] DEPT Provider Note   CSN: [id number] Arrival date & time: 01/13/18  1648   History   Chief Complaint No chief complaint on file.   HPI Stacie Wolfe is a 52 y.o. female presents for evaluation of anxiety attack.  States she was at court earlier today and she is been under a lot of stress where she felt chest pain and started breathing quickly.  States they had to call EMS to calm her down.  Feels like she has been worked up with a lot of legal issues.  Pain is rated a 2/10.  Pain does not radiate.  Denies shortness of breath, cough, hemoptysis, abdominal pain, headache, neck stiffness, nausea, vomiting, diarrhea, constipation.  She has had episodes of anxiety attacks in the past, however feels this felt different.  Denies tobacco use, hypercholesterolemia, history of heart disease. Admits to a hx of HTN.  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  HPI  Past Medical History:  Diagnosis Date  . Asthma   . Deaf   . Depression   . Hypertension   . Migraine   . Stroke [facility])     Patient Active Problem List   Diagnosis Date Noted  . Hypertension 10/08/2017  . Asthma 09/01/2017    Past Surgical History:  Procedure Laterality Date  . ABDOMINAL SURGERY    . CHOLECYSTECTOMY    . COSMETIC SURGERY    . GASTRIC BYPASS    . TUBAL LIGATION       OB History   None      Home Medications    Prior to Admission medications   Medication Sig Start Date End Date Taking? Authorizing Provider  albuterol (PROVENTIL HFA;VENTOLIN HFA) 108 (90 Base) MCG/ACT inhaler Inhale 2 puffs into the lungs every 6 (six) hours as needed for wheezing or shortness of breath. 09/01/17   Charlott Rakes, MD  cloNIDine (CATAPRES) 0.1 MG tablet Take 1 tablet (0.1 mg total) by mouth at bedtime. For hot flashes 09/01/17   Charlott Rakes, MD  cyclobenzaprine (FLEXERIL) 10 MG tablet Take 1 tablet (10 mg total) by mouth 2 (two) times daily as  needed for muscle spasms. 10/08/17   Charlott Rakes, MD  gabapentin (NEURONTIN) 300 MG capsule Take 1 capsule (300 mg total) by mouth 2 (two) times daily. 09/01/17   Charlott Rakes, MD  lisinopril (PRINIVIL,ZESTRIL) 5 MG tablet Take 1 tablet (5 mg total) by mouth daily. 10/08/17   Charlott Rakes, MD  meloxicam (MOBIC) 7.5 MG tablet Take 1 tablet (7.5 mg total) by mouth daily. 10/08/17   Charlott Rakes, MD  metroNIDAZOLE (METROGEL VAGINAL) 0.75 % vaginal gel Place 1 Applicatorful vaginally at bedtime. 12/01/17   Charlott Rakes, MD  ondansetron (ZOFRAN ODT) 4 MG disintegrating tablet Take 1 tablet (4 mg total) by mouth every 8 (eight) hours as needed for nausea or vomiting. 06/16/17   Duffy Bruce, MD  traMADol (ULTRAM) 50 MG tablet Take 1 tablet (50 mg total) by mouth every 12 (twelve) hours as needed. 10/08/17   Charlott Rakes, MD    Family History No family history on file.  Social History Social History   Tobacco Use  . Smoking status: Never Smoker  . Smokeless tobacco: Never Used  Substance Use Topics  . Alcohol use: Yes    Comment: weekends   . Drug use: No     Allergies   Verapamil   Review of Systems Review of Systems  Constitutional: Negative.   Respiratory:  Negative.   Cardiovascular: Positive for chest pain. Negative for palpitations and leg swelling.  Gastrointestinal: Negative.   Genitourinary: Negative.   Musculoskeletal: Negative.   Skin: Negative.   Psychiatric/Behavioral: The patient is nervous/anxious.   All other systems reviewed and are negative.    Physical Exam Updated Vital Signs BP 137/81 (BP Location: Left Arm)   Pulse 82   Temp 98.7 F (37.1 C) (Oral)   Resp 18   SpO2 100%   Physical Exam  Constitutional: She appears well-developed and well-nourished. No distress.  HENT:  Head: Atraumatic.  Eyes: Pupils are equal, round, and reactive to light. Conjunctivae are normal.  Neck: Normal range of motion. Neck supple.  Cardiovascular: Normal  rate, regular rhythm, normal heart sounds, intact distal pulses and normal pulses. Exam reveals no gallop and no friction rub.  No murmur heard. Pulmonary/Chest: Effort normal and breath sounds normal. No stridor. No respiratory distress. She has no wheezes. She has no rales. She exhibits no tenderness.  Abdominal: Soft. Bowel sounds are normal. She exhibits no distension and no mass. There is no tenderness. There is no guarding.  Musculoskeletal: Normal range of motion.  Neurological: She is alert.  Skin: Skin is warm and dry. She is not diaphoretic. No erythema. No pallor.  Psychiatric: She has a normal mood and affect.  Nursing note and vitals reviewed.   ED Treatments / Results  Labs (all labs ordered are listed, but only abnormal results are displayed) Labs Reviewed  CBC - Abnormal; Notable for the following components:      Result Value   Hemoglobin 8.5 (*)    HCT 31.4 (*)    MCV 64.2 (*)    MCH 17.4 (*)    MCHC 27.1 (*)    RDW 24.3 (*)    All other components within normal limits  BASIC METABOLIC PANEL  I-STAT TROPONIN, ED    EKG None  Radiology Dg Chest 2 View  Result Date: 01/13/2018 CLINICAL DATA:  Chest pain and dyspnea.  Anxiety. EXAM: CHEST - 2 VIEW COMPARISON:  06/15/2017 FINDINGS: The heart size and mediastinal contours are within normal limits. Slightly shallow inspiration redemonstrated with minimal left basilar atelectasis. No pulmonary consolidation or CHF. No effusion or pneumothorax. Degenerative changes are present along the dorsal spine without acute osseous appearing abnormality. IMPRESSION: Stable appearance of the chest with minimal left basilar atelectasis. No acute cardiopulmonary abnormality. Electronically Signed   By: Ashley Royalty M.D.   On: 01/13/2018 19:00    Procedures Procedures (including critical care time)  Medications Ordered in ED Medications  hydrOXYzine (ATARAX/VISTARIL) tablet 25 mg (25 mg Oral Given 01/13/18 1830)  LORazepam (ATIVAN)  tablet 1 mg (1 mg Oral Given 01/13/18 2030)     Initial Impression / Assessment and Plan / ED Course  I have reviewed the triage vital signs and the nursing notes also past medical history.  Pertinent labs & imaging results that were available during my care of the patient were reviewed by me and considered in my medical decision making (see chart for details).  Presents for evaluation of chest pain and anxiety.  Admits to a history of anxiety however has not had take medicine for this before. Will obtain labs, EKG and chest xray and give Hydroxyzine as this is likely due to anxiety. Low suspicion for cardiac pathology.  PERC negative. Heart Score low risk. Patient states relief of discomfort with Ativan.  Patient is to be discharged with recommendation to follow up with PCP in regards  to today's hospital visit. Chest pain is not likely of cardiac or pulmonary etiology d/t presentation, PERC negative, VSS, no tracheal deviation, no JVD or new murmur, RRR, breath sounds equal bilaterally, EKG without acute abnormalities, negative troponin, and negative CXR.  Hbg 8 which appears at baseline. Pt has been advised to return to the ED if CP becomes exertional, associated with diaphoresis or nausea, radiates to left jaw/arm, worsens or becomes concerning in any way.  Discussed with patient counseling resources and ways to manage her anxiety.  Refer to a counseling center.  Pt appears reliable for follow up and is agreeable to discharge.    EKG Interpretation  Date/Time:    Ventricular Rate: 85   PR Interval:    QRS Duration:   QT Interval:    QTC Calculation:   R Axis:     Text Interpretation:  NSR      Clinical Course as of Jan 14 2107  Tue Jan 13, 2018  2038 HbG at baseline from previous visits  Hemoglobin(!): 8.5 [BH]    Clinical Course User Index [BH] Dakota Vanwart A, PA-C    Final Clinical Impressions(s) / ED Diagnoses   Final diagnoses:  Anxiety    ED Discharge Orders     None       Monaye Blackie A, PA-C 01/13/18 2108    Daleen Bo, MD 01/13/18 2348

## 2018-01-13 NOTE — ED Notes (Signed)
Patient is able to sit till but reports she remains anxious. Patient is requesting something to eat. Interpreter, Lynder Parents, for assessment.

## 2018-01-13 NOTE — ED Notes (Signed)
Provided patient a [place] sandwich and ginger ale with permission from provider.

## 2018-01-13 NOTE — ED Notes (Signed)
Pt refused to use sign language interpretor via [place]. Pt demanding a live person for interpretation. Pt unable to be fully triaged at this time.

## 2018-01-13 NOTE — Discharge Instructions (Addendum)
You were seen today for an anxiety attack.  I have attached a resource guide for you for counseling services.  We did run a chest pain work-up on you to make sure your heart was not the cause of your pain.  The results of everything were negative.  Follow-up with your primary care provider or return to the ED for the following symptoms.  Your chest pain is worse. You have a cough that gets worse, or you cough up blood. You have very bad (severe) pain in your belly (abdomen). You are very weak. You pass out (faint). You have either of these for no clear reason: Sudden chest discomfort. Sudden discomfort in your arms, back, neck, or jaw. You have shortness of breath at any time. You suddenly start to sweat, or your skin gets clammy. You feel sick to your stomach (nauseous). You throw up (vomit). You suddenly feel light-headed or dizzy. Your heart starts to beat fast, or it feels like it is skipping beats.

## 2018-01-13 NOTE — Progress Notes (Signed)
Consult request has been received. CSW attempting to follow up at present time.  CSW retrieving taxi voucher now and will provide to RN shortly.  CSW will continue to follow for D/C needs.  Alphonse Guild. Seraphine Gudiel, LCSW, LCAS, CSI Clinical Social Worker Ph: [phone]

## 2018-02-26 ENCOUNTER — Other Ambulatory Visit: Payer: Self-pay | Admitting: Family Medicine

## 2018-02-26 DIAGNOSIS — J4520 Mild intermittent asthma, uncomplicated: Secondary | ICD-10-CM

## 2018-02-26 DIAGNOSIS — R202 Paresthesia of skin: Secondary | ICD-10-CM

## 2018-02-26 MED FILL — cloNIDine HCL 0.1 MG TABS: 0.1 | 30 days supply | Qty: 30 | Fill #2

## 2018-02-26 MED FILL — CYCLOBENZAPRINE 10 MG TAB: 10 | 15 days supply | Qty: 30 | Fill #1

## 2018-02-27 MED FILL — VENTOLIN HFA 90 MCG INHALER: 108 (90 BAS | 25 days supply | Qty: 18 | Fill #0

## 2018-02-28 ENCOUNTER — Other Ambulatory Visit: Payer: Self-pay

## 2018-02-28 ENCOUNTER — Encounter (HOSPITAL_COMMUNITY): Payer: Self-pay | Admitting: Emergency Medicine

## 2018-02-28 ENCOUNTER — Emergency Department (HOSPITAL_COMMUNITY)
Admission: EM | Admit: 2018-02-28 | Discharge: 2018-02-28 | Disposition: A | Discharge status: Home / Self Care | Payer: Medicare Other | Attending: Emergency Medicine | Admitting: Emergency Medicine

## 2018-02-28 DIAGNOSIS — I10 Essential (primary) hypertension: Secondary | ICD-10-CM | POA: Insufficient documentation

## 2018-02-28 DIAGNOSIS — Z79899 Other long term (current) drug therapy: Secondary | ICD-10-CM | POA: Insufficient documentation

## 2018-02-28 DIAGNOSIS — R6884 Jaw pain: Secondary | ICD-10-CM | POA: Diagnosis present

## 2018-02-28 DIAGNOSIS — R52 Pain, unspecified: Secondary | ICD-10-CM | POA: Diagnosis not present

## 2018-02-28 DIAGNOSIS — R110 Nausea: Secondary | ICD-10-CM | POA: Diagnosis not present

## 2018-02-28 DIAGNOSIS — K0889 Other specified disorders of teeth and supporting structures: Secondary | ICD-10-CM | POA: Diagnosis not present

## 2018-02-28 DIAGNOSIS — R000 Tachycardia, unspecified: Secondary | ICD-10-CM | POA: Diagnosis not present

## 2018-02-28 DIAGNOSIS — J45909 Unspecified asthma, uncomplicated: Secondary | ICD-10-CM | POA: Insufficient documentation

## 2018-02-28 MED ORDER — CLINDAMYCIN HCL 150 MG PO CAPS: 300.0000 mg | ORAL_CAPSULE | Freq: Three times a day (TID) | ORAL | 0 refills | Status: DC

## 2018-02-28 NOTE — ED Notes (Signed)
Pt discharged from ED; instructions provided and scripts given; Pt encouraged to return to ED if symptoms worsen and to f/u with PCP; Pt verbalized understanding of all instructions 

## 2018-02-28 NOTE — Discharge Instructions (Addendum)
Follow up with PCP and dentist as dicussed

## 2018-02-28 NOTE — ED Triage Notes (Signed)
Pt to ER by EMS for evaluation of left jaw pain and nausea onset last night. EMS EKG showing normal sinus rhythm.VSS. Patient states believes this is related to a dental procedure she had "years" ago. Pt is deaf and blind. Interpreter utilized.

## 2018-02-28 NOTE — ED Provider Notes (Signed)
[facility] EMERGENCY DEPARTMENT Provider Note   CSN: [id number] Arrival date & time: 02/28/18  1047     History   Chief Complaint Chief Complaint  Patient presents with  . Jaw Pain    HPI Stacie Wolfe is a 52 y.o. female.  The history is provided by the patient.  Dental Pain   This is a recurrent problem. The current episode started more than 1 week ago. The problem occurs daily. The problem has not changed since onset.The pain is at a severity of 4/10. The pain is mild. She has tried acetaminophen for the symptoms. The treatment provided mild relief.    Past Medical History:  Diagnosis Date  . Asthma   . Deaf   . Depression   . Hypertension   . Migraine   . Stroke [facility])     Patient Active Problem List   Diagnosis Date Noted  . Hypertension 10/08/2017  . Asthma 09/01/2017    Past Surgical History:  Procedure Laterality Date  . ABDOMINAL SURGERY    . CHOLECYSTECTOMY    . COSMETIC SURGERY    . GASTRIC BYPASS    . TUBAL LIGATION       OB History   None      Home Medications    Prior to Admission medications   Medication Sig Start Date End Date Taking? Authorizing Provider  clindamycin (CLEOCIN) 150 MG capsule Take 2 capsules (300 mg total) by mouth 3 (three) times daily for 7 days. 02/28/18 03/07/18  Pedro Whiters, DO  cloNIDine (CATAPRES) 0.1 MG tablet Take 1 tablet (0.1 mg total) by mouth at bedtime. For hot flashes 09/01/17   Charlott Rakes, MD  cyclobenzaprine (FLEXERIL) 10 MG tablet Take 1 tablet (10 mg total) by mouth 2 (two) times daily as needed for muscle spasms. 10/08/17   Charlott Rakes, MD  gabapentin (NEURONTIN) 300 MG capsule TAKE 1 CAPSULE (300 MG TOTAL) BY MOUTH 2 (TWO) TIMES DAILY. 02/27/18   Ladell Pier, MD  lisinopril (PRINIVIL,ZESTRIL) 5 MG tablet Take 1 tablet (5 mg total) by mouth daily. 10/08/17   Charlott Rakes, MD  meloxicam (MOBIC) 7.5 MG tablet Take 1 tablet (7.5 mg total) by mouth daily. 10/08/17    Charlott Rakes, MD  metroNIDAZOLE (METROGEL VAGINAL) 0.75 % vaginal gel Place 1 Applicatorful vaginally at bedtime. 12/01/17   Charlott Rakes, MD  ondansetron (ZOFRAN ODT) 4 MG disintegrating tablet Take 1 tablet (4 mg total) by mouth every 8 (eight) hours as needed for nausea or vomiting. 06/16/17   Duffy Bruce, MD  traMADol (ULTRAM) 50 MG tablet Take 1 tablet (50 mg total) by mouth every 12 (twelve) hours as needed. 10/08/17   Charlott Rakes, MD  VENTOLIN HFA 108 (90 Base) MCG/ACT inhaler INHALE 2 PUFFS INTO THE LUNGS EVERY 6 (SIX) HOURS AS NEEDED FOR WHEEZING OR SHORTNESS OF BREATH. 02/27/18   Charlott Rakes, MD    Family History No family history on file.  Social History Social History   Tobacco Use  . Smoking status: Never Smoker  . Smokeless tobacco: Never Used  Substance Use Topics  . Alcohol use: Yes    Comment: weekends   . Drug use: No     Allergies   Verapamil   Review of Systems Review of Systems  Constitutional: Negative for chills and fever.  HENT: Positive for dental problem. Negative for ear pain and sore throat.   Eyes: Negative for pain and visual disturbance.  Respiratory: Negative for cough and shortness  of breath.   Cardiovascular: Negative for chest pain and palpitations.  Gastrointestinal: Negative for abdominal pain and vomiting.  Genitourinary: Negative for dysuria and hematuria.  Musculoskeletal: Negative for arthralgias and back pain.  Skin: Negative for color change and rash.  Neurological: Negative for seizures and syncope.  All other systems reviewed and are negative.    Physical Exam Updated Vital Signs  ED Triage Vitals  Enc Vitals Group     BP 02/28/18 1000 (!) 152/71     Pulse Rate 02/28/18 1000 75     Resp 02/28/18 1000 16     Temp 02/28/18 1000 98.9 F (37.2 C)     Temp Source 02/28/18 1000 Oral     SpO2 02/28/18 1000 96 %     Weight --      Height --      Head Circumference --      Peak Flow --      Pain Score  02/28/18 1100 10     Pain Loc --      Pain Edu? --      Excl. in [place]? --     Physical Exam  Constitutional: She appears well-developed and well-nourished. No distress.  HENT:  Head: Normocephalic and atraumatic.  Right Ear: External ear normal.  Left Ear: External ear normal.  Mouth/Throat: Oropharynx is clear and moist. No oropharyngeal exudate.  Dental caries throughout, no obvious signs of gingival abscess  Eyes: Pupils are equal, round, and reactive to light. Conjunctivae and EOM are normal.  Neck: Normal range of motion. Neck supple.  Cardiovascular: Normal rate and regular rhythm.  No murmur heard. Pulmonary/Chest: Effort normal and breath sounds normal. No respiratory distress.  Abdominal: Soft. There is no tenderness.  Musculoskeletal: She exhibits no edema.  Neurological: She is alert.  Skin: Skin is warm and dry.  Psychiatric: She has a normal mood and affect.  Nursing note and vitals reviewed.    ED Treatments / Results  Labs (all labs ordered are listed, but only abnormal results are displayed) Labs Reviewed - No data to display  EKG None  Radiology No results found.  Procedures Procedures (including critical care time)  Medications Ordered in ED Medications - No data to display   Initial Impression / Assessment and Plan / ED Course  I have reviewed the triage vital signs and the nursing notes.  Pertinent labs & imaging results that were available during my care of the patient were reviewed by me and considered in my medical decision making (see chart for details).     Stacie Wolfe is a 52 year old female with history of deafness and blindness who presents to the ED with left tooth pain.  Patient with unremarkable vitals.  No fever.  Patient states left upper tooth pain for last several weeks.  Has been unable to get into a dentist.  Has some poor dentition on exam.  No obvious abscess in the mouth.  She states that she has some ear pain as well.   Suspect that this is secondary to dental caries.  Patient given reassurance.  She does have follow-up with her primary care doctor on Tuesday and recommend that he help with finding her dentist as well.  Patient was given prescription for clindamycin and discharged from ED in good condition.  Told her return to ED if symptoms worsen.  This chart was dictated using voice recognition software.  Despite best efforts to proofread,  errors can occur which can change the documentation meaning.  Final Clinical Impressions(s) / ED Diagnoses   Final diagnoses:  Pain, dental    ED Discharge Orders         Ordered    clindamycin (CLEOCIN) 150 MG capsule  3 times daily     02/28/18 1237           Lennice Sites, DO 02/28/18 1335

## 2018-03-02 MED FILL — GABAPENTIN 300 MG CAPSULE: 300 | 30 days supply | Qty: 60 | Fill #0

## 2018-03-03 ENCOUNTER — Encounter: Payer: Self-pay | Admitting: Family Medicine

## 2018-03-03 ENCOUNTER — Ambulatory Visit: Payer: Medicare Other | Attending: Family Medicine | Admitting: Family Medicine

## 2018-03-03 VITALS — BP 158/89 | HR 72 | Temp 98.0°F | Ht 61.0 in | Wt 186.0 lb

## 2018-03-03 DIAGNOSIS — F410 Panic disorder [episodic paroxysmal anxiety] without agoraphobia: Secondary | ICD-10-CM | POA: Insufficient documentation

## 2018-03-03 DIAGNOSIS — R0981 Nasal congestion: Secondary | ICD-10-CM | POA: Diagnosis not present

## 2018-03-03 DIAGNOSIS — F419 Anxiety disorder, unspecified: Secondary | ICD-10-CM | POA: Insufficient documentation

## 2018-03-03 DIAGNOSIS — Z791 Long term (current) use of non-steroidal anti-inflammatories (NSAID): Secondary | ICD-10-CM | POA: Insufficient documentation

## 2018-03-03 DIAGNOSIS — F329 Major depressive disorder, single episode, unspecified: Secondary | ICD-10-CM | POA: Insufficient documentation

## 2018-03-03 DIAGNOSIS — Z760 Encounter for issue of repeat prescription: Secondary | ICD-10-CM | POA: Diagnosis not present

## 2018-03-03 DIAGNOSIS — G8929 Other chronic pain: Secondary | ICD-10-CM | POA: Insufficient documentation

## 2018-03-03 DIAGNOSIS — M25551 Pain in right hip: Secondary | ICD-10-CM

## 2018-03-03 DIAGNOSIS — H6121 Impacted cerumen, right ear: Secondary | ICD-10-CM

## 2018-03-03 DIAGNOSIS — J4520 Mild intermittent asthma, uncomplicated: Secondary | ICD-10-CM

## 2018-03-03 DIAGNOSIS — D649 Anemia, unspecified: Secondary | ICD-10-CM | POA: Diagnosis not present

## 2018-03-03 DIAGNOSIS — R202 Paresthesia of skin: Secondary | ICD-10-CM

## 2018-03-03 DIAGNOSIS — I10 Essential (primary) hypertension: Secondary | ICD-10-CM | POA: Diagnosis not present

## 2018-03-03 DIAGNOSIS — M25552 Pain in left hip: Secondary | ICD-10-CM | POA: Insufficient documentation

## 2018-03-03 DIAGNOSIS — Z79899 Other long term (current) drug therapy: Secondary | ICD-10-CM | POA: Insufficient documentation

## 2018-03-03 DIAGNOSIS — Z9884 Bariatric surgery status: Secondary | ICD-10-CM | POA: Insufficient documentation

## 2018-03-03 DIAGNOSIS — Z9049 Acquired absence of other specified parts of digestive tract: Secondary | ICD-10-CM | POA: Insufficient documentation

## 2018-03-03 DIAGNOSIS — Z8673 Personal history of transient ischemic attack (TIA), and cerebral infarction without residual deficits: Secondary | ICD-10-CM | POA: Insufficient documentation

## 2018-03-03 DIAGNOSIS — D6489 Other specified anemias: Secondary | ICD-10-CM

## 2018-03-03 MED ORDER — CYCLOBENZAPRINE HCL 10 MG PO TABS: 10.0000 mg | ORAL_TABLET | Freq: Two times a day (BID) | ORAL | 1 refills | Status: DC | PRN

## 2018-03-03 MED ORDER — MELOXICAM 7.5 MG PO TABS: 7.5000 mg | ORAL_TABLET | Freq: Every day | ORAL | 1 refills | Status: DC

## 2018-03-03 MED ORDER — GABAPENTIN 300 MG PO CAPS: 300.0000 mg | ORAL_CAPSULE | Freq: Two times a day (BID) | ORAL | 3 refills | Status: DC

## 2018-03-03 MED ORDER — FERROUS SULFATE 325 (65 FE) MG PO TABS: 325.0000 mg | ORAL_TABLET | Freq: Two times a day (BID) | ORAL | 3 refills | Status: DC

## 2018-03-03 MED ORDER — ALBUTEROL SULFATE HFA 108 (90 BASE) MCG/ACT IN AERS: 2.0000 | INHALATION_SPRAY | Freq: Four times a day (QID) | RESPIRATORY_TRACT | 2 refills | Status: DC | PRN

## 2018-03-03 MED ORDER — LISINOPRIL 10 MG PO TABS: 10.0000 mg | ORAL_TABLET | Freq: Every day | ORAL | 3 refills | Status: DC

## 2018-03-03 MED FILL — LISINOPRIL 10 MG TABS: 10 | 90 days supply | Qty: 90 | Fill #0

## 2018-03-03 MED FILL — MELOXICAM 7.5 MG TABLET: 7.5 | 30 days supply | Qty: 30 | Fill #0
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
## 2018-03-04 ENCOUNTER — Telehealth: Payer: Self-pay

## 2018-03-04 ENCOUNTER — Observation Stay (HOSPITAL_COMMUNITY)
Admission: EM | Admit: 2018-03-04 | Discharge: 2018-03-05 | Disposition: A | Discharge status: Home / Self Care | Payer: Medicare Other | Attending: Internal Medicine | Admitting: Internal Medicine

## 2018-03-04 ENCOUNTER — Encounter (HOSPITAL_COMMUNITY): Payer: Self-pay

## 2018-03-04 DIAGNOSIS — R079 Chest pain, unspecified: Secondary | ICD-10-CM | POA: Diagnosis present

## 2018-03-04 DIAGNOSIS — D649 Anemia, unspecified: Principal | ICD-10-CM | POA: Diagnosis present

## 2018-03-04 DIAGNOSIS — Z9119 Patient's noncompliance with other medical treatment and regimen: Secondary | ICD-10-CM | POA: Insufficient documentation

## 2018-03-04 DIAGNOSIS — R531 Weakness: Secondary | ICD-10-CM | POA: Diagnosis present

## 2018-03-04 DIAGNOSIS — Z9884 Bariatric surgery status: Secondary | ICD-10-CM | POA: Diagnosis not present

## 2018-03-04 DIAGNOSIS — J4520 Mild intermittent asthma, uncomplicated: Secondary | ICD-10-CM | POA: Diagnosis not present

## 2018-03-04 DIAGNOSIS — R000 Tachycardia, unspecified: Secondary | ICD-10-CM | POA: Diagnosis not present

## 2018-03-04 DIAGNOSIS — H913 Deaf nonspeaking, not elsewhere classified: Secondary | ICD-10-CM | POA: Diagnosis not present

## 2018-03-04 DIAGNOSIS — K0889 Other specified disorders of teeth and supporting structures: Secondary | ICD-10-CM | POA: Diagnosis not present

## 2018-03-04 DIAGNOSIS — I10 Essential (primary) hypertension: Secondary | ICD-10-CM | POA: Diagnosis present

## 2018-03-04 DIAGNOSIS — Z87891 Personal history of nicotine dependence: Secondary | ICD-10-CM | POA: Diagnosis not present

## 2018-03-04 DIAGNOSIS — Z8673 Personal history of transient ischemic attack (TIA), and cerebral infarction without residual deficits: Secondary | ICD-10-CM | POA: Insufficient documentation

## 2018-03-04 DIAGNOSIS — J45909 Unspecified asthma, uncomplicated: Secondary | ICD-10-CM | POA: Diagnosis not present

## 2018-03-04 LAB — IRON,TIBC AND FERRITIN PANEL
FERRITIN: 2 ng/mL — AB (ref 15–150)
IRON SATURATION: 3 % — AB (ref 15–55)
IRON: 14 ug/dL — AB (ref 27–159)
TIBC: 539 ug/dL — AB (ref 250–450)
UIBC: 525 ug/dL — ABNORMAL HIGH (ref 131–425)

## 2018-03-04 LAB — CBC WITH DIFFERENTIAL/PLATELET
BASOS: 1 %
Basophils Absolute: 0 10*3/uL (ref 0.0–0.2)
EOS (ABSOLUTE): 0.1 10*3/uL (ref 0.0–0.4)
EOS: 1 %
HEMOGLOBIN: 6.9 g/dL — AB (ref 11.1–15.9)
Hematocrit: 26.6 % — ABNORMAL LOW (ref 34.0–46.6)
IMMATURE GRANS (ABS): 0 10*3/uL (ref 0.0–0.1)
Immature Granulocytes: 1 %
LYMPHS ABS: 1.5 10*3/uL (ref 0.7–3.1)
LYMPHS: 25 %
MCH: 16.5 pg — ABNORMAL LOW (ref 26.6–33.0)
MCHC: 25.9 g/dL — ABNORMAL LOW (ref 31.5–35.7)
MCV: 64 fL — AB (ref 79–97)
MONOCYTES: 11 %
Monocytes Absolute: 0.6 10*3/uL (ref 0.1–0.9)
NEUTROS ABS: 3.6 10*3/uL (ref 1.4–7.0)
Neutrophils: 61 %
Platelets: 270 10*3/uL (ref 150–450)
RBC: 4.17 x10E6/uL (ref 3.77–5.28)
RDW: 23.3 % — ABNORMAL HIGH (ref 12.3–15.4)
WBC: 5.9 10*3/uL (ref 3.4–10.8)

## 2018-03-04 LAB — ABO/RH: ABO/RH(D): O POS

## 2018-03-04 LAB — BASIC METABOLIC PANEL
ANION GAP: 7 (ref 5–15)
BUN: 5 mg/dL — ABNORMAL LOW (ref 6–20)
CALCIUM: 10.2 mg/dL (ref 8.9–10.3)
CO2: 23 mmol/L (ref 22–32)
CREATININE: 0.57 mg/dL (ref 0.44–1.00)
Chloride: 105 mmol/L (ref 98–111)
GLUCOSE: 110 mg/dL — AB (ref 70–99)
Potassium: 3.7 mmol/L (ref 3.5–5.1)
Sodium: 135 mmol/L (ref 135–145)

## 2018-03-04 LAB — URINALYSIS, ROUTINE W REFLEX MICROSCOPIC
BILIRUBIN URINE: NEGATIVE
Glucose, UA: NEGATIVE mg/dL
HGB URINE DIPSTICK: NEGATIVE
KETONES UR: NEGATIVE mg/dL
Leukocytes, UA: NEGATIVE
Nitrite: NEGATIVE
PROTEIN: NEGATIVE mg/dL
Specific Gravity, Urine: 1.021 (ref 1.005–1.030)
pH: 5 (ref 5.0–8.0)

## 2018-03-04 LAB — CBC
HCT: 29.1 % — ABNORMAL LOW (ref 36.0–46.0)
Hemoglobin: 7.4 g/dL — ABNORMAL LOW (ref 12.0–15.0)
MCH: 16.4 pg — ABNORMAL LOW (ref 26.0–34.0)
MCHC: 25.4 g/dL — ABNORMAL LOW (ref 30.0–36.0)
MCV: 64.4 fL — ABNORMAL LOW (ref 80.0–100.0)
PLATELETS: 230 10*3/uL (ref 150–400)
RBC: 4.52 MIL/uL (ref 3.87–5.11)
RDW: 24.3 % — AB (ref 11.5–15.5)
WBC: 4.8 10*3/uL (ref 4.0–10.5)
nRBC: 0.6 % — ABNORMAL HIGH (ref 0.0–0.2)

## 2018-03-04 LAB — PREPARE RBC (CROSSMATCH)

## 2018-03-04 LAB — I-STAT BETA HCG BLOOD, ED (MC, WL, AP ONLY)

## 2018-03-04 MED ORDER — LISINOPRIL 10 MG PO TABS
10.0000 mg | ORAL_TABLET | Freq: Every day | ORAL | Status: DC
  Administered 2018-03-05: 10 mg via ORAL
  Filled 2018-03-04: qty 1

## 2018-03-04 MED ORDER — HYDROCODONE-ACETAMINOPHEN 5-325 MG PO TABS: 1.0000 | ORAL_TABLET | ORAL | Status: DC | PRN

## 2018-03-04 MED ORDER — ACETAMINOPHEN 650 MG RE SUPP: 650.0000 mg | Freq: Four times a day (QID) | RECTAL | Status: DC | PRN

## 2018-03-04 MED ORDER — ACETAMINOPHEN 325 MG PO TABS: 650.0000 mg | ORAL_TABLET | Freq: Four times a day (QID) | ORAL | Status: DC | PRN

## 2018-03-04 MED ORDER — CLONIDINE HCL 0.1 MG PO TABS
0.1000 mg | ORAL_TABLET | Freq: Every day | ORAL | Status: DC
  Administered 2018-03-05: 0.1 mg via ORAL
  Filled 2018-03-04: qty 1

## 2018-03-04 MED ORDER — SODIUM CHLORIDE 0.9 % IV SOLN
INTRAVENOUS | Status: DC
  Administered 2018-03-05: 03:00:00 via INTRAVENOUS

## 2018-03-04 MED ORDER — CLINDAMYCIN HCL 300 MG PO CAPS
300.0000 mg | ORAL_CAPSULE | Freq: Three times a day (TID) | ORAL | Status: DC
  Administered 2018-03-05: 300 mg via ORAL
  Filled 2018-03-04: qty 1

## 2018-03-04 MED ORDER — ONDANSETRON HCL 4 MG/2ML IJ SOLN: 4.0000 mg | Freq: Four times a day (QID) | INTRAMUSCULAR | Status: DC | PRN

## 2018-03-04 MED ORDER — TRAMADOL HCL 50 MG PO TABS: 50.0000 mg | ORAL_TABLET | Freq: Two times a day (BID) | ORAL | Status: DC | PRN

## 2018-03-04 MED ORDER — SODIUM CHLORIDE 0.9 % IV SOLN: 10.0000 mL/h | Freq: Once | INTRAVENOUS | Status: DC

## 2018-03-04 MED ORDER — GABAPENTIN 300 MG PO CAPS
300.0000 mg | ORAL_CAPSULE | Freq: Two times a day (BID) | ORAL | Status: DC
  Administered 2018-03-05 (×2): 300 mg via ORAL
  Filled 2018-03-04 (×2): qty 1

## 2018-03-04 MED ORDER — SODIUM CHLORIDE 0.9% IV SOLUTION
Freq: Once | INTRAVENOUS | Status: AC
  Administered 2018-03-05: 03:00:00 via INTRAVENOUS

## 2018-03-04 MED ORDER — FERROUS SULFATE 325 (65 FE) MG PO TABS
325.0000 mg | ORAL_TABLET | Freq: Two times a day (BID) | ORAL | Status: DC
  Administered 2018-03-05: 325 mg via ORAL
  Filled 2018-03-04: qty 1

## 2018-03-04 MED ORDER — ONDANSETRON HCL 4 MG PO TABS: 4.0000 mg | ORAL_TABLET | Freq: Four times a day (QID) | ORAL | Status: DC | PRN

## 2018-03-04 NOTE — ED Notes (Signed)
Admitting at bedside 

## 2018-03-04 NOTE — Telephone Encounter (Signed)
At request of Dr Margarita Rana, Call placed to the patient with the assistance of relay interpreter, [name] # [phone] and informed her that she needs to go to the ED as she is severely anemic and may need a transfusion. She said she understood and would go.  She then said that she is not sure that she has transportation but will check with her daughter.

## 2018-03-04 NOTE — H&P (Signed)
Stacie Wolfe CZY:[id number] DOB: [birth year] DOA: 03/04/2018     PCP: Charlott Rakes, MD   Outpatient Specialists: NONE   Patient arrived to ER on 03/04/18 at 1755  Patient coming from: home Lives  With family    Chief Complaint:  Chief Complaint  Patient presents with  . 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   History obtained through interpreter Presented with abnormal labs she went into see her primary care provider today to check her ER because it was hurting routine labs were done that showed that she was anemic she has been feeling somewhat weak and dizzy but not short of breath She has been having chest pain on and off since yesterda currently chest pain free. Lasting 7 min  At a time Denies any melena no black stools no blood in stools No heavy menses for years.  she has continued to have generalized weakness feeling lightheaded and dizzy Recently had do a flu shot   She have been having muscle aches worse in her body.  Reports subjective fever yesterday 98  has been transfused before the same range it looks  Like  Reports she have had jaw pain was diagnosed with tooth infection and started on Cleocin     Reports she has been out of her medications 2 weeks ago  She has been off iron for a very long time   While in ER: Noted to have hemoglobin down to 7.4 The following Work up has been ordered so far:  Orders Placed This Encounter  Procedures  . Basic metabolic panel  . CBC  . Urinalysis, Routine w reflex microscopic  . Cardiac monitoring  . [place] IV, Maintain IV access  . Orthostatic vital signs  . Consult to hospitalist  . Pulse oximetry, continuous  . I-Stat beta hCG blood, ED  . ED EKG  . EKG 12-Lead  . Type and screen [place]  . ABO/Rh     Following Medications were ordered in  ER: Medications - No data to display  Significant initial  Findings: Abnormal Labs Reviewed  BASIC METABOLIC PANEL - Abnormal; Notable for the following components:      Result Value   Glucose, Bld 110 (*)    BUN 5 (*)    All other components within normal limits  CBC - Abnormal; Notable for the following components:   Hemoglobin 7.4 (*)    HCT 29.1 (*)    MCV 64.4 (*)    MCH 16.4 (*)    MCHC 25.4 (*)    RDW 24.3 (*)    nRBC 0.6 (*)    All other components within normal limits  URINALYSIS, ROUTINE W REFLEX MICROSCOPIC - Abnormal; Notable for the following components:   Color, Urine AMBER (*)    APPearance HAZY (*)    All other components within normal limits     Na 135 K 3.7 BUN 5  Cr   Stable,  Lab Results  Component Value Date   CREATININE 0.57 03/04/2018   CREATININE 0.50 01/13/2018   CREATININE 0.59 06/15/2017      WBC  4.8  HG/HCT Baseline around 8.5 but was down to 6.9 yesterday    Component Value Date/Time   HGB 7.4 (L) 03/04/2018 1933   HGB 6.9 (LL) 03/03/2018 1406   HCT 29.1 (L) 03/04/2018 1933   HCT  26.6 (L) 03/03/2018 1406    Lactic Acid, Venous    Component Value Date/Time   LATICACIDVEN 1.86 06/15/2017 2359      UA  no evidence of UTI      ECG:  Personally reviewed by me showing: HR : 101 Rhythm: Sinus tachycardia     no evidence of ischemic changes QTC 464     ED Triage Vitals  Enc Vitals Group     BP 03/04/18 1910 132/65     Pulse Rate 03/04/18 1910 73     Resp 03/04/18 1910 18     Temp 03/04/18 1910 98.2 F (36.8 C)     Temp Source 03/04/18 1910 Oral     SpO2 03/04/18 1910 99 %     Weight --      Height --      Head Circumference --      Peak Flow --      Pain Score 03/04/18 1916 0     Pain Loc --      Pain Edu? --      Excl. in [place]? --   TMAX(24)@       Latest  Blood pressure 132/65, pulse 73, temperature 98.2 F (36.8 C), temperature source Oral, resp. rate 18, SpO2 99 %.   Hospitalist was called for admission  for symptomatic  anemia   Review of Systems:    Pertinent positives include: fatigue, lightheadedness  Constitutional:  No weight loss, night sweats, Fevers, chills, weight loss  HEENT:  No headaches, Difficulty swallowing,Tooth/dental problems,Sore throat,  No sneezing, itching, ear ache, nasal congestion, post nasal drip,  Cardio-vascular:  No chest pain, Orthopnea, PND, anasarca, dizziness, palpitations.no Bilateral lower extremity swelling  GI:  No heartburn, indigestion, abdominal pain, nausea, vomiting, diarrhea, change in bowel habits, loss of appetite, melena, blood in stool, hematemesis Resp:  no shortness of breath at rest. No dyspnea on exertion, No excess mucus, no productive cough, No non-productive cough, No coughing up of blood.No change in color of mucus.No wheezing. Skin:  no rash or lesions. No jaundice GU:  no dysuria, change in color of urine, no urgency or frequency. No straining to urinate.  No flank pain.  Musculoskeletal:  No joint pain or no joint swelling. No decreased range of motion. No back pain.  Psych:  No change in mood or affect. No depression or anxiety. No memory loss.  Neuro: no localizing neurological complaints, no tingling, no weakness, no double vision, no gait abnormality, no slurred speech, no confusion  All systems reviewed and apart from [place] all are negative  Past Medical History:   Past Medical History:  Diagnosis Date  . Asthma   . Deaf   . Depression   . Hypertension   . Migraine   . Stroke [facility])       Past Surgical History:  Procedure Laterality Date  . ABDOMINAL SURGERY    . CHOLECYSTECTOMY    . COSMETIC SURGERY    . GASTRIC BYPASS    . TUBAL LIGATION      Social History:  Ambulatory independently      reports that she has never smoked. She has never used smokeless tobacco. She reports that she drinks alcohol. She reports that she does not use drugs.     Family History:   Family History  Problem Relation  Age of Onset  . Cancer Mother   . Lymphoma Mother   . Cancer Other   . Diabetes Neg Hx  Allergies: Allergies  Allergen Reactions  . Verapamil Hives     Prior to Admission medications   Medication Sig Start Date End Date Taking? Authorizing Provider  albuterol (VENTOLIN HFA) 108 (90 Base) MCG/ACT inhaler Inhale 2 puffs into the lungs every 6 (six) hours as needed for wheezing or shortness of breath. 03/03/18   Charlott Rakes, MD  clindamycin (CLEOCIN) 150 MG capsule Take 2 capsules (300 mg total) by mouth 3 (three) times daily for 7 days. 02/28/18 03/07/18  [place], Adam, DO  cloNIDine (CATAPRES) 0.1 MG tablet Take 1 tablet (0.1 mg total) by mouth at bedtime. For hot flashes 09/01/17   Charlott Rakes, MD  cyclobenzaprine (FLEXERIL) 10 MG tablet Take 1 tablet (10 mg total) by mouth 2 (two) times daily as needed for muscle spasms. 03/03/18   Charlott Rakes, MD  ferrous sulfate 325 (65 FE) MG tablet Take 1 tablet (325 mg total) by mouth 2 (two) times daily with a meal. 03/03/18   Charlott Rakes, MD  gabapentin (NEURONTIN) 300 MG capsule Take 1 capsule (300 mg total) by mouth 2 (two) times daily. 03/03/18   Charlott Rakes, MD  lisinopril (PRINIVIL,ZESTRIL) 10 MG tablet Take 1 tablet (10 mg total) by mouth daily. 03/03/18   Charlott Rakes, MD  meloxicam (MOBIC) 7.5 MG tablet Take 1 tablet (7.5 mg total) by mouth daily. 03/03/18   Charlott Rakes, MD  metroNIDAZOLE (METROGEL VAGINAL) 0.75 % vaginal gel Place 1 Applicatorful vaginally at bedtime. 12/01/17   Charlott Rakes, MD  ondansetron (ZOFRAN ODT) 4 MG disintegrating tablet Take 1 tablet (4 mg total) by mouth every 8 (eight) hours as needed for nausea or vomiting. 06/16/17   Duffy Bruce, MD  traMADol (ULTRAM) 50 MG tablet Take 1 tablet (50 mg total) by mouth every 12 (twelve) hours as needed. 10/08/17   Charlott Rakes, MD   Physical Exam: Blood pressure 132/65, pulse 73, temperature 98.2 F (36.8 C), temperature source Oral,  resp. rate 18, SpO2 99 %. 1. General:  in No Acute distress  Chronically ill  -appearing 2. Psychological: Alert and Oriented 3. Head/ENT:     Dry Mucous Membranes                          Head Non traumatic, neck supple                            Poor Dentition 4. SKIN: normal   Skin turgor,  Skin clean Dry and intact no rash 5. Heart: Regular rate and rhythm no  Murmur, no Rub or gallop 6. Lungs: Clear to auscultation bilaterally, no wheezes or crackles   7. Abdomen: Soft,   non-tender, Non distended   obese  bowel sounds present 8. Lower extremities: no clubbing, cyanosis, or edema 9. Neurologically Grossly intact, moving all 4 extremities equally   10. MSK: Normal range of motion   LABS:     Recent Labs  Lab 03/03/18 1406 03/04/18 1933  WBC 5.9 4.8  NEUTROABS 3.6  --   HGB 6.9* 7.4*  HCT 26.6* 29.1*  MCV 64* 64.4*  PLT 270 732   Basic Metabolic Panel: Recent Labs  Lab 03/04/18 1933  NA 135  K 3.7  CL 105  CO2 23  GLUCOSE 110*  BUN 5*  CREATININE 0.57  CALCIUM 10.2      No results for input(s): AST, ALT, ALKPHOS, BILITOT, PROT, ALBUMIN in the last 168 hours.  No results for input(s): LIPASE, AMYLASE in the last 168 hours. No results for input(s): AMMONIA in the last 168 hours.    HbA1C: No results for input(s): HGBA1C in the last 72 hours. CBG: No results for input(s): GLUCAP in the last 168 hours.    Urine analysis:    Component Value Date/Time   COLORURINE AMBER (A) 03/04/2018 2030   APPEARANCEUR HAZY (A) 03/04/2018 2030   LABSPEC 1.021 03/04/2018 2030   PHURINE 5.0 03/04/2018 2030   GLUCOSEU NEGATIVE 03/04/2018 2030   [place] NEGATIVE 03/04/2018 2030   [place] NEGATIVE 03/04/2018 2030   BILIRUBINUR negative 11/07/2016 1505   KETONESUR NEGATIVE 03/04/2018 2030   PROTEINUR NEGATIVE 03/04/2018 2030   UROBILINOGEN 0.2 11/07/2016 1505   NITRITE NEGATIVE 03/04/2018 2030   LEUKOCYTESUR NEGATIVE 03/04/2018 2030      Cultures: No results found  for: [place], [place], [place], REPTSTATUS   Radiological Exams on Admission: No results found.  Chart has been reviewed   Assessment/Plan  52 y.o. female with medical history significant of deaf, asthma, depression, hypertension, migraines, stroke  Admitted for symptomatic anemia  Present on Admission: . Symptomatic anemia - -   cause of Anemia unclear,  -Obtain anemia panel - Hemoccult stool 3 - Check TSH - If evidence of iron deficiency anemia or Hemoccult positive stools will need further GI workup - Obtain type and screen,  Transfuse 1 unit as patient being significantly symptomatic Patient has been noncompliant with vitamin supplementation  . Chest pain -symptomatic anemia event monitor on telemetry cycle cardiac enzymes . Hypertension -restart home medications patient has been noncompliant . Asthma restart home medications patient currently stable no evidence of acute exacerbation   Other plan as per orders.  DVT prophylaxis:  SCD    Code Status:  FULL CODE  as per patient   I had personally discussed CODE STATUS with patient    Family Communication:   Family not at  Bedside    Disposition Plan:      To home once workup is complete and patient is stable                                      Consults called: none    Admission status:    Obs    Level of care   telementry       Ling Flesch 03/04/2018, 11:44 PM    [facility]  Pager [phone]   after 2 AM please page floor coverage PA If 7AM-7PM, please contact the day team taking care of the patient  [email or web address]  Password [email or web address]

## 2018-03-04 NOTE — ED Triage Notes (Signed)
Pt is Deaf. Pt states that she went to see her PCP today for a normal check up and ear pain, had routine lab draws and was told that she was anemic and needed an IV, states that since yesterday she feels weak and dizzy, denies SOB.

## 2018-03-04 NOTE — ED Provider Notes (Signed)
[facility] EMERGENCY DEPARTMENT Provider Note   CSN: [id number] Arrival date & time: 03/04/18  1755     History   Chief Complaint Chief Complaint  Patient presents with  . Abnormal Lab    HPI Stacie Wolfe is a 52 y.o. female.  HPI Patient presents to the emergency department with feeling weak and dizzy this started 2 days ago.  Patient received a phone call for her doctor stating that her hemoglobin was low.  The patient's had this happen in the past.  The patient states that nothing seems make the condition better or worse.  She states she did not take any medications prior to arrival her symptoms.  The patient denies chest pain, shortness of breath, headache,blurred vision, neck pain, fever, cough,  numbness,  anorexia, edema, abdominal pain, nausea, vomiting, diarrhea, rash, back pain, dysuria, hematemesis, bloody stool, near syncope, or syncope. Past Medical History:  Diagnosis Date  . Asthma   . Deaf   . Depression   . Hypertension   . Migraine   . Stroke [facility])     Patient Active Problem List   Diagnosis Date Noted  . Symptomatic anemia 03/04/2018  . Chest pain 03/04/2018  . Anemia 03/03/2018  . Hypertension 10/08/2017  . Asthma 09/01/2017    Past Surgical History:  Procedure Laterality Date  . ABDOMINAL SURGERY    . CHOLECYSTECTOMY    . COSMETIC SURGERY    . GASTRIC BYPASS    . TUBAL LIGATION       OB History   None      Home Medications    Prior to Admission medications   Medication Sig Start Date End Date Taking? Authorizing Provider  albuterol (VENTOLIN HFA) 108 (90 Base) MCG/ACT inhaler Inhale 2 puffs into the lungs every 6 (six) hours as needed for wheezing or shortness of breath. 03/03/18   Charlott Rakes, MD  clindamycin (CLEOCIN) 150 MG capsule Take 2 capsules (300 mg total) by mouth 3 (three) times daily for 7 days. 02/28/18 03/07/18  Curatolo, Adam, DO  cloNIDine (CATAPRES) 0.1 MG tablet Take 1 tablet (0.1 mg  total) by mouth at bedtime. For hot flashes 09/01/17   Charlott Rakes, MD  cyclobenzaprine (FLEXERIL) 10 MG tablet Take 1 tablet (10 mg total) by mouth 2 (two) times daily as needed for muscle spasms. 03/03/18   Charlott Rakes, MD  ferrous sulfate 325 (65 FE) MG tablet Take 1 tablet (325 mg total) by mouth 2 (two) times daily with a meal. 03/03/18   Charlott Rakes, MD  gabapentin (NEURONTIN) 300 MG capsule Take 1 capsule (300 mg total) by mouth 2 (two) times daily. 03/03/18   Charlott Rakes, MD  lisinopril (PRINIVIL,ZESTRIL) 10 MG tablet Take 1 tablet (10 mg total) by mouth daily. 03/03/18   Charlott Rakes, MD  meloxicam (MOBIC) 7.5 MG tablet Take 1 tablet (7.5 mg total) by mouth daily. 03/03/18   Charlott Rakes, MD  metroNIDAZOLE (METROGEL VAGINAL) 0.75 % vaginal gel Place 1 Applicatorful vaginally at bedtime. 12/01/17   Charlott Rakes, MD  ondansetron (ZOFRAN ODT) 4 MG disintegrating tablet Take 1 tablet (4 mg total) by mouth every 8 (eight) hours as needed for nausea or vomiting. 06/16/17   Duffy Bruce, MD  traMADol (ULTRAM) 50 MG tablet Take 1 tablet (50 mg total) by mouth every 12 (twelve) hours as needed. 10/08/17   Charlott Rakes, MD    Family History Family History  Problem Relation Age of Onset  . Cancer Mother   .  Lymphoma Mother   . Cancer Other   . Diabetes Neg Hx     Social History Social History   Tobacco Use  . Smoking status: Former Research scientist (life sciences)  . Smokeless tobacco: Never Used  Substance Use Topics  . Alcohol use: Yes    Comment: weekends   . Drug use: No     Allergies   Verapamil   Review of Systems Review of Systems All other systems negative except as documented in the HPI. All pertinent positives and negatives as reviewed in the HPI.  Physical Exam Updated Vital Signs BP 139/69   Pulse 77   Temp 98.2 F (36.8 C) (Oral)   Resp (!) 21   SpO2 98%   Physical Exam  Constitutional: She is oriented to person, place, and time. She appears  well-developed and well-nourished. No distress.  HENT:  Head: Normocephalic and atraumatic.  Mouth/Throat: Oropharynx is clear and moist.  Eyes: Pupils are equal, round, and reactive to light.  Neck: Normal range of motion. Neck supple.  Cardiovascular: Normal rate, regular rhythm and normal heart sounds. Exam reveals no gallop and no friction rub.  No murmur heard. Pulmonary/Chest: Effort normal and breath sounds normal. No respiratory distress. She has no wheezes.  Abdominal: Soft. Bowel sounds are normal. She exhibits no distension. There is no tenderness.  Neurological: She is alert and oriented to person, place, and time. She exhibits normal muscle tone. Coordination normal.  Skin: Skin is warm and dry. Capillary refill takes less than 2 seconds. No rash noted. No erythema.  Psychiatric: She has a normal mood and affect. Her behavior is normal.  Nursing note and vitals reviewed.    ED Treatments / Results  Labs (all labs ordered are listed, but only abnormal results are displayed) Labs Reviewed  BASIC METABOLIC PANEL - Abnormal; Notable for the following components:      Result Value   Glucose, Bld 110 (*)    BUN 5 (*)    All other components within normal limits  CBC - Abnormal; Notable for the following components:   Hemoglobin 7.4 (*)    HCT 29.1 (*)    MCV 64.4 (*)    MCH 16.4 (*)    MCHC 25.4 (*)    RDW 24.3 (*)    nRBC 0.6 (*)    All other components within normal limits  URINALYSIS, ROUTINE W REFLEX MICROSCOPIC - Abnormal; Notable for the following components:   Color, Urine AMBER (*)    APPearance HAZY (*)    All other components within normal limits  VITAMIN B12  FOLATE  IRON AND TIBC  FERRITIN  RETICULOCYTES  TROPONIN I  TROPONIN I  TROPONIN I  I-STAT BETA HCG BLOOD, ED ([facility], [facility], [facility] ONLY)  TYPE AND SCREEN  ABO/RH  PREPARE RBC (CROSSMATCH)    EKG None  Radiology No results found.  Procedures Procedures (including critical care  time)  Medications Ordered in ED Medications  0.9 %  sodium chloride infusion (has no administration in time range)     Initial Impression / Assessment and Plan / ED Course  I have reviewed the triage vital signs and the nursing notes.  Pertinent labs & imaging results that were available during my care of the patient were reviewed by me and considered in my medical decision making (see chart for details).    CRITICAL CARE Performed by: Resa Miner Treavor Blomquist Total critical care time: 30 minutes Critical care time was exclusive of separately billable procedures and treating other  patients. Critical care was necessary to treat or prevent imminent or life-threatening deterioration. Critical care was time spent personally by me on the following activities: development of treatment plan with patient and/or surrogate as well as nursing, discussions with consultants, evaluation of patient's response to treatment, examination of patient, obtaining history from patient or surrogate, ordering and performing treatments and interventions, ordering and review of laboratory studies, ordering and review of radiographic studies, pulse oximetry and re-evaluation of patient's condition.  I ordered 2 units of blood for the patient.  She will be added to the hospital for further evaluation and care.  Patient agrees the plan and all questions were answered.  It was given due to symptomatic anemia. Final Clinical Impressions(s) / ED Diagnoses   Final diagnoses:  Symptomatic anemia    ED Discharge Orders    None       Dalia Heading, Hershal Coria 03/04/18 2339    Mesner, Corene Cornea, MD 03/07/18 [phone]

## 2018-03-05 ENCOUNTER — Other Ambulatory Visit: Payer: Self-pay

## 2018-03-05 ENCOUNTER — Telehealth: Payer: Self-pay

## 2018-03-05 DIAGNOSIS — Z87891 Personal history of nicotine dependence: Secondary | ICD-10-CM | POA: Diagnosis not present

## 2018-03-05 DIAGNOSIS — Z9119 Patient's noncompliance with other medical treatment and regimen: Secondary | ICD-10-CM | POA: Diagnosis not present

## 2018-03-05 DIAGNOSIS — I10 Essential (primary) hypertension: Secondary | ICD-10-CM | POA: Diagnosis not present

## 2018-03-05 DIAGNOSIS — R079 Chest pain, unspecified: Secondary | ICD-10-CM | POA: Diagnosis not present

## 2018-03-05 DIAGNOSIS — D509 Iron deficiency anemia, unspecified: Secondary | ICD-10-CM

## 2018-03-05 DIAGNOSIS — D649 Anemia, unspecified: Secondary | ICD-10-CM

## 2018-03-05 DIAGNOSIS — Z8673 Personal history of transient ischemic attack (TIA), and cerebral infarction without residual deficits: Secondary | ICD-10-CM | POA: Diagnosis not present

## 2018-03-05 DIAGNOSIS — K0889 Other specified disorders of teeth and supporting structures: Secondary | ICD-10-CM | POA: Diagnosis not present

## 2018-03-05 DIAGNOSIS — Z9884 Bariatric surgery status: Secondary | ICD-10-CM | POA: Diagnosis not present

## 2018-03-05 DIAGNOSIS — J45909 Unspecified asthma, uncomplicated: Secondary | ICD-10-CM | POA: Diagnosis not present

## 2018-03-05 DIAGNOSIS — H913 Deaf nonspeaking, not elsewhere classified: Secondary | ICD-10-CM | POA: Diagnosis not present

## 2018-03-05 DIAGNOSIS — R7989 Other specified abnormal findings of blood chemistry: Secondary | ICD-10-CM | POA: Diagnosis not present

## 2018-03-05 LAB — COMPREHENSIVE METABOLIC PANEL
ALT: 23 U/L (ref 0–44)
AST: 26 U/L (ref 15–41)
Albumin: 3 g/dL — ABNORMAL LOW (ref 3.5–5.0)
Alkaline Phosphatase: 81 U/L (ref 38–126)
Anion gap: 6 (ref 5–15)
BUN: 7 mg/dL (ref 6–20)
CO2: 23 mmol/L (ref 22–32)
Calcium: 9.7 mg/dL (ref 8.9–10.3)
Chloride: 108 mmol/L (ref 98–111)
Creatinine, Ser: 0.51 mg/dL (ref 0.44–1.00)
GFR calc Af Amer: >60 mL/min (ref >60)
GFR calc non Af Amer: >60 mL/min (ref >60)
Glucose, Bld: 100 mg/dL — ABNORMAL HIGH (ref 70–99)
Potassium: 3.3 mmol/L — ABNORMAL LOW (ref 3.5–5.1)
Sodium: 137 mmol/L (ref 135–145)
Total Bilirubin: 1.4 mg/dL — ABNORMAL HIGH (ref 0.3–1.2)
Total Protein: 6.1 g/dL — ABNORMAL LOW (ref 6.5–8.1)

## 2018-03-05 LAB — CBC
HCT: 33.4 % — ABNORMAL LOW (ref 36.0–46.0)
HEMOGLOBIN: 9 g/dL — AB (ref 12.0–15.0)
MCH: 18.4 pg — ABNORMAL LOW (ref 26.0–34.0)
MCHC: 26.9 g/dL — ABNORMAL LOW (ref 30.0–36.0)
MCV: 68.3 fL — ABNORMAL LOW (ref 80.0–100.0)
PLATELETS: 200 10*3/uL (ref 150–400)
RBC: 4.89 MIL/uL (ref 3.87–5.11)
RDW: 25.8 % — ABNORMAL HIGH (ref 11.5–15.5)
WBC: 4.6 10*3/uL (ref 4.0–10.5)
nRBC: 0.4 % — ABNORMAL HIGH (ref 0.0–0.2)

## 2018-03-05 LAB — TSH: TSH: 0.245 u[IU]/mL — ABNORMAL LOW (ref 0.350–4.500)

## 2018-03-05 LAB — MAGNESIUM: Magnesium: 2.3 mg/dL (ref 1.7–2.4)

## 2018-03-05 LAB — IRON AND TIBC
Iron: 12 ug/dL — ABNORMAL LOW (ref 28–170)
Saturation Ratios: 2 % — ABNORMAL LOW (ref 10.4–31.8)
TIBC: 526 ug/dL — ABNORMAL HIGH (ref 250–450)
UIBC: 514 ug/dL

## 2018-03-05 LAB — VITAMIN B12: VITAMIN B 12: 112 pg/mL — AB (ref 180–914)

## 2018-03-05 LAB — TROPONIN I
Troponin I: <0.03 ng/mL (ref <0.03)
Troponin I: <0.03 ng/mL (ref <0.03)

## 2018-03-05 LAB — RETICULOCYTES
Immature Retic Fract: 11.4 % (ref 2.3–15.9)
RBC.: 4.14 MIL/uL (ref 3.87–5.11)
Retic Count, Absolute: 55.5 10*3/uL (ref 19.0–186.0)
Retic Ct Pct: 1.3 % (ref 0.4–3.1)

## 2018-03-05 LAB — PHOSPHORUS: Phosphorus: 2.8 mg/dL (ref 2.5–4.6)

## 2018-03-05 LAB — FERRITIN: FERRITIN: 4 ng/mL — AB (ref 11–307)

## 2018-03-05 LAB — FOLATE: Folate: 21 ng/mL (ref >5.9)

## 2018-03-05 LAB — T4, FREE: FREE T4: 0.79 ng/dL — AB (ref 0.82–1.77)

## 2018-03-05 MED ORDER — SODIUM CHLORIDE 0.9 % IV SOLN
510.0000 mg | Freq: Once | INTRAVENOUS | Status: AC
  Administered 2018-03-05: 510 mg via INTRAVENOUS
  Filled 2018-03-05: qty 17

## 2018-03-05 MED ORDER — MULTI-VITAMIN/MINERALS PO TABS: 1.0000 | ORAL_TABLET | Freq: Every day | ORAL | 0 refills | Status: AC

## 2018-03-05 MED ORDER — AMOXICILLIN 500 MG PO CAPS: 500.0000 mg | ORAL_CAPSULE | Freq: Three times a day (TID) | ORAL | 0 refills | Status: DC

## 2018-03-05 MED ORDER — ALBUTEROL SULFATE HFA 108 (90 BASE) MCG/ACT IN AERS: 2.0000 | INHALATION_SPRAY | Freq: Four times a day (QID) | RESPIRATORY_TRACT | 0 refills | Status: DC | PRN

## 2018-03-05 MED ORDER — CYANOCOBALAMIN 1000 MCG/ML IJ SOLN
1000.0000 ug | Freq: Once | INTRAMUSCULAR | Status: AC
  Administered 2018-03-05: 1000 ug via INTRAMUSCULAR
  Filled 2018-03-05: qty 1

## 2018-03-05 MED ORDER — B-12 1000 MCG SL SUBL: 1.0000 | SUBLINGUAL_TABLET | Freq: Every day | SUBLINGUAL | 0 refills | Status: DC

## 2018-03-05 MED ORDER — AMOXICILLIN 500 MG PO CAPS
500.0000 mg | ORAL_CAPSULE | Freq: Three times a day (TID) | ORAL | Status: DC
  Administered 2018-03-05: 500 mg via ORAL
  Filled 2018-03-05: qty 1

## 2018-03-05 MED FILL — CERTAVITE-ANTIOXIDANT TAB: 30 days supply | Qty: 30 | Fill #0

## 2018-03-05 MED FILL — AMOXICILLIN 500 MG CAPS: 500 | 5 days supply | Qty: 15 | Fill #0

## 2018-03-05 NOTE — Progress Notes (Signed)
Paged admitting about double blood transfusion orders. Waiting for clarification.

## 2018-03-05 NOTE — Telephone Encounter (Signed)
Call received from [facility], RN/CM requesting a hospital follow up appointment for the patient. An appointment was scheduled for 03/19/18 @ 0950 and the information was placed on the AVS.   The patient will need to speak to the [facility] SW when she is in the clinic.  She will also need assistance with scheduling an appointment with an eye doctor and dentist.  The patient is aware that she needs to contact [facility] to schedule an assessment.

## 2018-03-05 NOTE — Care Management Note (Signed)
Case Management Note  Patient Details  Name: Stacie Wolfe MRN: [id number] Date of Birth: 03/17/1966  Subjective/Objective:  Pt presented for Symptomatic Anemia. Received PRBC:s. PTA from home with several family members. Patient looking for new housing at this time. Recently just relocated to [place]. Pt has PCP at the [facility]- Spoke with [name] Case Manager @ the [facility] and an appointment has been scheduled and placed on AVS.                 Action/Plan: Interpreter @ the bedside- patient stated she needs new glasses and a dentist. Patient has Medicare and Medicaid. Patient will receive assistance via the [facility] in regards to dental and vision appointments. The CSW will work to see what offices are close to patient and on the bus route so patient can be compliant with appointments. CSW to provide the patient with [employer], [facility], [facility] information. [place] CSW will alos assist with housing needs further. If patient is transitioned home on new medications please send Rx's to the [facility]. No further needs from CM at this time.   Expected Discharge Date:  03/05/18               Expected Discharge Plan:  Home/Self Care  In-House Referral:  Clinical Social Work, Therapist, music  CM Consult, Follow-up appt scheduled, Medication Assistance, [facility]  Post Acute Care Choice:  NA Choice offered to:  NA  DME Arranged:  N/A DME Agency:  NA  HH Arranged:  NA HH Agency:  NA  Status of Service:  Completed, signed off  If discussed at [place] of Stay Meetings, dates discussed:    Additional Comments:  [name], RN 03/05/2018, 2:07 PM

## 2018-03-05 NOTE — Discharge Summary (Signed)
Physician Discharge Summary  Stacie Wolfe LSL:[id number] DOB: Feb 18, 1966 DOA: 03/04/2018  PCP: Charlott Rakes, MD  Admit date: 03/04/2018 Discharge date: 03/05/2018  Admitted From: home Discharge disposition: home   Recommendations for Outpatient Follow-Up:   1. Cbc 1 week-- ? If absorbing B12/Fe s/p gastric bypass-- patient not able to tell me what kind-- needs colonoscopy, B12 SL or injections and probably PRN IV Fe infusions 2. Needs dental follow up 3. Repeat TSH, free t4-- ? Secondary hypothyroidism-- dec fT4 and TSH   Discharge Diagnosis:   Active Problems:   Asthma   Hypertension   Symptomatic anemia   Chest pain    Discharge Condition: Improved.  Diet recommendation: Low sodium, heart healthy  Wound care: None.  Code status: Full.   History of Present Illness:   Presented with abnormal labs she went into see her primary care provider today to check her ER because it was hurting routine labs were done that showed that she was anemic she has been feeling somewhat weak and dizzy but not short of breath She has been having chest pain on and off since yesterda currently chest pain free. Lasting 7 min  At a time Denies any melena no black stools no blood in stools No heavy menses for years.   Hospital Course by Problem:   Symptomatic anemia-- suspect Fe def as MCV low -B12 also low-- replace -s/p 2 units PRBC with improvement to 9 -needs colonoscopy -TSH was checked-- TSH was low as was free t4-- ? Secondary hypothyroid--- recheck as an outpatient -follow labs closely  Dental pain -changed abx from clindamycin to amoxicillin  S/p gastric bypass? -unsure of what kind -needs MVI plus possible IV Fe and IM B12  HTN -resume home meds  Asthma -resume home meds   Medical Consultants:      Discharge Exam:   Vitals:   03/05/18 0800 03/05/18 1200  BP: (!) 153/93 128/69  Pulse: 72 72  Resp: 20 16  Temp: 98.7 F (37.1 C) 98.6 F  (37 C)  SpO2: 94% 98%   Vitals:   03/05/18 0448 03/05/18 0501 03/05/18 0800 03/05/18 1200  BP: (!) 141/76 (!) 143/71 (!) 153/93 128/69  Pulse: 66 72 72 72  Resp: 19 19 20 16   Temp: 98.3 F (36.8 C) 98.2 F (36.8 C) 98.7 F (37.1 C) 98.6 F (37 C)  TempSrc: Oral Oral Oral Oral  SpO2: 96% 97% 94% 98%    General exam: Appears calm and comfortable. Feeling better, up walking the hallways  The results of significant diagnostics from this hospitalization (including imaging, microbiology, ancillary and laboratory) are listed below for reference.     Procedures and Diagnostic Studies:   No results found.   Labs:   Basic Metabolic Panel: Recent Labs  Lab 03/04/18 1933 03/05/18 0926  NA 135 137  K 3.7 3.3*  CL 105 108  CO2 23 23  GLUCOSE 110* 100*  BUN 5* 7  CREATININE 0.57 0.51  CALCIUM 10.2 9.7  MG  --  2.3  PHOS  --  2.8   GFR Estimated Creatinine Clearance: 81 mL/min (by C-G formula based on SCr of 0.51 mg/dL). Liver Function Tests: Recent Labs  Lab 03/05/18 0926  AST 26  ALT 23  ALKPHOS 81  BILITOT 1.4*  PROT 6.1*  ALBUMIN 3.0*   No results for input(s): LIPASE, AMYLASE in the last 168 hours. No results for input(s): AMMONIA in the last 168 hours. Coagulation profile No results  for input(s): INR, PROTIME in the last 168 hours.  CBC: Recent Labs  Lab 03/03/18 1406 03/04/18 1933 03/05/18 0926  WBC 5.9 4.8 4.6  NEUTROABS 3.6  --   --   HGB 6.9* 7.4* 9.0*  HCT 26.6* 29.1* 33.4*  MCV 64* 64.4* 68.3*  PLT 270 230 200   Cardiac Enzymes: Recent Labs  Lab 03/04/18 2322 03/05/18 0926  TROPONINI <0.03 <0.03   BNP: Invalid input(s): POCBNP CBG: No results for input(s): GLUCAP in the last 168 hours. D-Dimer No results for input(s): DDIMER in the last 72 hours. Hgb A1c No results for input(s): HGBA1C in the last 72 hours. Lipid Profile No results for input(s): CHOL, HDL, LDLCALC, TRIG, CHOLHDL, LDLDIRECT in the last 72 hours. Thyroid function  studies Recent Labs    03/05/18 0926  TSH 0.245*   Anemia work up Recent Labs    03/03/18 1406 03/04/18 2259  VITAMINB12  --  112*  FOLATE  --  21.0  FERRITIN 2* 4*  TIBC 539* 526*  IRON 14* 12*  RETICCTPCT  --  1.3   Microbiology No results found for this or any previous visit (from the past 240 hour(s)).   Discharge Instructions:   Discharge Instructions    Diet general   Complete by:  As directed    Discharge instructions   Complete by:  As directed    You need a screening colonoscopy You need B12 replacement as well as a multivitamin and iron (suspect you may not be absorbing the Fe by mouth and your PCP may need to set up periodic IV iron infusions)   Increase activity slowly   Complete by:  As directed      Allergies as of 03/05/2018      Reactions   Verapamil Hives      Medication List    STOP taking these medications   BC HEADACHE POWDER PO   clindamycin 150 MG capsule Commonly known as:  CLEOCIN   cyclobenzaprine 10 MG tablet Commonly known as:  FLEXERIL   ferrous sulfate 325 (65 FE) MG tablet   meloxicam 7.5 MG tablet Commonly known as:  MOBIC   ondansetron 4 MG disintegrating tablet Commonly known as:  ZOFRAN-ODT   traMADol 50 MG tablet Commonly known as:  ULTRAM     TAKE these medications   albuterol 108 (90 Base) MCG/ACT inhaler Commonly known as:  PROVENTIL HFA;VENTOLIN HFA Inhale 2 puffs into the lungs every 6 (six) hours as needed for wheezing or shortness of breath.   amoxicillin 500 MG capsule Commonly known as:  AMOXIL Take 1 capsule (500 mg total) by mouth every 8 (eight) hours.   B-12 1000 MCG Subl Place 1 tablet under the tongue daily.   cloNIDine 0.1 MG tablet Commonly known as:  CATAPRES Take 1 tablet (0.1 mg total) by mouth at bedtime. For hot flashes   gabapentin 300 MG capsule Commonly known as:  NEURONTIN Take 1 capsule (300 mg total) by mouth 2 (two) times daily.   lisinopril 10 MG tablet Commonly known  as:  PRINIVIL,ZESTRIL Take 1 tablet (10 mg total) by mouth daily.   multivitamin with minerals tablet Take 1 tablet by mouth daily.      Follow-up Information    [place]. Go on 03/19/2018.   Why:  at 9:50am with Dr Margarita Rana.    Contact information: [place] [id number] [place] .  Contact information: [place] [phone]           Time coordinating discharge: 25 min  Signed:  Geradine Girt  [facility] 03/05/2018, 3:18 PM

## 2018-03-06 ENCOUNTER — Telehealth: Payer: Self-pay | Admitting: Family Medicine

## 2018-03-06 LAB — BPAM RBC
BLOOD PRODUCT EXPIRATION DATE: 201911262359
BLOOD PRODUCT EXPIRATION DATE: 201911262359
ISSUE DATE / TIME: 201910240443
ISSUE DATE / TIME: 201910240231
Unit Type and Rh: 5100
Unit Type and Rh: 5100

## 2018-03-06 LAB — TYPE AND SCREEN
ABO/RH(D): O POS
ANTIBODY SCREEN: NEGATIVE
Unit division: 0
Unit division: 0

## 2018-03-06 NOTE — Telephone Encounter (Signed)
Patient has called to see if she could get a in-person interpreter for her upcoming visit Nov.7th. She was seen in the ED and was told she needs to get  [facility] lab test done. Please follow up with patient.

## 2018-03-09 ENCOUNTER — Telehealth: Payer: Self-pay

## 2018-03-09 NOTE — Telephone Encounter (Signed)
Transition Care Management Follow-up Telephone Call  Call completed with the assistance of Relay interpreter [phone].    Date of discharge and from where:   03/05/18 , [facility]   How have you been since you were released from the hospital?   She reported feeling " good, much better." no complaints reported  Any questions or concerns? No questions/concerns at this time.   Items Reviewed:  Did the pt receive and understand the discharge instructions provided? She said that she has her discharge instructions and has no questions, noting   "I'm fine."   Medications obtained and verified?   She said that she has all medications and did not need to review the discharge list. She said that she knows she is to stop taking 7 medications. She said that she has the amoicillin but took it once yesterday and developed a " bad headache."  She said that she got scared and stopped taking it,  Informed her that this CM would notify her PCP. No headaches reported since.  She then stated that she still needs to pick up the B-12 tomorrow and she needs to buy the multivitamins with minerals.   Any new allergies since your discharge?  None reported   Do you have support at home? She said that she lives with her husband , her daughter and her daughter's friend.  No one has a car. She ( patient) stated that she takes the bus to work by herself.   Other (ie: DME, Home Health, etc) no other orders noted. The patient states that she needs to see a dentist and also needs eyeglasses.   Functional Questionnaire: (I = Independent and D = Dependent)  Bathing/Dressing- I   Meal Prep- I  Eating- I  Maintaining continence- I  Transferring/Ambulation- I  Managing Meds- I   Follow up appointments reviewed:    PCP Hospital f/u appt confirmed? Yes - 03/19/18 @ 0950 with Dr [facility] f/u appt confirmed?no appointments scheduled .  Are transportation arrangements needed? She may  need transportation to the clinic. CM to call her next week to determine if cab is needed. Provided the patient with the phone # for [facility] eligibility. She said that an application was submitted but she never received a call about scheduling an assessment.  Encouraged her to call to check the status of her application.   If their condition worsens, is the pt aware to call  their PCP or go to the ED?yes  Was the patient provided with contact information for the PCP's office or ED? She said that she has the number for the clinic.  Was the pt encouraged to call back with questions or concerns? yes

## 2018-03-09 NOTE — Telephone Encounter (Signed)
Who needs to be contacted to get this in person interpreter.

## 2018-03-09 NOTE — Telephone Encounter (Signed)
Dr Margarita Rana - from discharge call  She said that she has all medications and did not need to review the discharge list. She said that she knows she is to stop taking 7 medications. She said that she has the amoicillin but took it once yesterday and developed a " bad headache."  She said that she got scared and stopped taking it,  Informed her that this CM would notify her PCP. No headaches reported since.  She then stated that she still needs to pick up the B-12 tomorrow and she needs to buy the multivitamins with minerals.

## 2018-03-10 NOTE — Telephone Encounter (Signed)
Call placed to the patient with the assistance of relay interpreter # [id number].  Informed patient that Dr Wynetta Emery recommends that she try taking the Amoxil again and take as prescribed if she tolerates it.  The patient said that she understood and will try it again.

## 2018-03-16 ENCOUNTER — Telehealth: Payer: Self-pay

## 2018-03-16 NOTE — Telephone Encounter (Signed)
Call placed to the patient with assistance of Relay interpreter # [id number].  The patient confirmed that she will need cab transportation to her appointment at [facility] on 03/19/18. Instructed her to be ready for pick up by 0845. She will be called the day prior to confirm that she is still coming.   Regarding the Amoxil , she said that she is taking it and is " feeling okay."  She said that she did not have any questions/concerns.

## 2018-03-19 ENCOUNTER — Ambulatory Visit: Payer: Medicare Other | Attending: Family Medicine | Admitting: Family Medicine

## 2018-03-19 ENCOUNTER — Encounter: Payer: Self-pay | Admitting: Family Medicine

## 2018-03-19 VITALS — BP 158/95 | HR 73 | Temp 97.6°F | Ht 61.0 in | Wt 173.0 lb

## 2018-03-19 DIAGNOSIS — H9193 Unspecified hearing loss, bilateral: Secondary | ICD-10-CM | POA: Insufficient documentation

## 2018-03-19 DIAGNOSIS — Z9884 Bariatric surgery status: Secondary | ICD-10-CM

## 2018-03-19 DIAGNOSIS — I10 Essential (primary) hypertension: Secondary | ICD-10-CM | POA: Diagnosis not present

## 2018-03-19 DIAGNOSIS — Z9049 Acquired absence of other specified parts of digestive tract: Secondary | ICD-10-CM | POA: Diagnosis not present

## 2018-03-19 DIAGNOSIS — E538 Deficiency of other specified B group vitamins: Secondary | ICD-10-CM | POA: Insufficient documentation

## 2018-03-19 DIAGNOSIS — D6489 Other specified anemias: Secondary | ICD-10-CM | POA: Diagnosis not present

## 2018-03-19 DIAGNOSIS — Z79899 Other long term (current) drug therapy: Secondary | ICD-10-CM | POA: Insufficient documentation

## 2018-03-19 DIAGNOSIS — Z1211 Encounter for screening for malignant neoplasm of colon: Secondary | ICD-10-CM | POA: Diagnosis not present

## 2018-03-19 DIAGNOSIS — Z888 Allergy status to other drugs, medicaments and biological substances status: Secondary | ICD-10-CM | POA: Insufficient documentation

## 2018-03-19 DIAGNOSIS — M25511 Pain in right shoulder: Secondary | ICD-10-CM | POA: Diagnosis not present

## 2018-03-19 DIAGNOSIS — M7989 Other specified soft tissue disorders: Secondary | ICD-10-CM | POA: Diagnosis not present

## 2018-03-19 DIAGNOSIS — D649 Anemia, unspecified: Secondary | ICD-10-CM | POA: Insufficient documentation

## 2018-03-19 DIAGNOSIS — J45909 Unspecified asthma, uncomplicated: Secondary | ICD-10-CM | POA: Diagnosis not present

## 2018-03-19 DIAGNOSIS — Z8673 Personal history of transient ischemic attack (TIA), and cerebral infarction without residual deficits: Secondary | ICD-10-CM | POA: Insufficient documentation

## 2018-03-19 DIAGNOSIS — G8929 Other chronic pain: Secondary | ICD-10-CM

## 2018-03-19 DIAGNOSIS — R7989 Other specified abnormal findings of blood chemistry: Secondary | ICD-10-CM

## 2018-03-19 MED ORDER — METHOCARBAMOL 500 MG PO TABS: 500.0000 mg | ORAL_TABLET | Freq: Three times a day (TID) | ORAL | 1 refills | Status: DC | PRN

## 2018-03-19 MED ORDER — B-12 1000 MCG SL SUBL: 1.0000 | SUBLINGUAL_TABLET | Freq: Every day | SUBLINGUAL | 3 refills | Status: AC
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
## 2018-03-20 LAB — BASIC METABOLIC PANEL
BUN/Creatinine Ratio: 22 (ref 9–23)
BUN: 13 mg/dL (ref 6–24)
CO2: 22 mmol/L (ref 20–29)
CREATININE: 0.6 mg/dL (ref 0.57–1.00)
Calcium: 10.7 mg/dL — ABNORMAL HIGH (ref 8.7–10.2)
Chloride: 103 mmol/L (ref 96–106)
GFR calc Af Amer: 121 mL/min/{1.73_m2} (ref >59)
GFR, EST NON AFRICAN AMERICAN: 105 mL/min/{1.73_m2} (ref >59)
Glucose: 86 mg/dL (ref 65–99)
Potassium: 4.9 mmol/L (ref 3.5–5.2)
Sodium: 139 mmol/L (ref 134–144)

## 2018-03-20 LAB — CBC WITH DIFFERENTIAL/PLATELET
Basophils Absolute: 0.1 10*3/uL (ref 0.0–0.2)
Basos: 2 %
EOS (ABSOLUTE): 0.1 10*3/uL (ref 0.0–0.4)
EOS: 2 %
Hematocrit: 41.6 % (ref 34.0–46.6)
Hemoglobin: 12.1 g/dL (ref 11.1–15.9)
Immature Grans (Abs): 0 10*3/uL (ref 0.0–0.1)
Immature Granulocytes: 0 %
LYMPHS ABS: 1.5 10*3/uL (ref 0.7–3.1)
Lymphs: 22 %
MCH: 21.1 pg — AB (ref 26.6–33.0)
MCHC: 29.1 g/dL — ABNORMAL LOW (ref 31.5–35.7)
MCV: 73 fL — AB (ref 79–97)
MONOCYTES: 9 %
Monocytes Absolute: 0.6 10*3/uL (ref 0.1–0.9)
NEUTROS ABS: 4.2 10*3/uL (ref 1.4–7.0)
Neutrophils: 65 %
Platelets: 318 10*3/uL (ref 150–450)
RBC: 5.74 x10E6/uL — ABNORMAL HIGH (ref 3.77–5.28)
RDW: 31.6 % — ABNORMAL HIGH (ref 12.3–15.4)
WBC: 6.5 10*3/uL (ref 3.4–10.8)

## 2018-03-20 LAB — TSH: TSH: 0.935 u[IU]/mL (ref 0.450–4.500)

## 2018-03-20 LAB — T4, FREE: Free T4: 0.89 ng/dL (ref 0.82–1.77)

## 2018-03-23 ENCOUNTER — Telehealth: Payer: Self-pay

## 2018-03-23 NOTE — Telephone Encounter (Signed)
Patient's results were sent via mychart.

## 2018-04-17 MED FILL — VENTOLIN HFA 90 MCG INHALER: 108 (90 BAS | 25 days supply | Qty: 18 | Fill #1

## 2018-05-01 MED FILL — cloNIDine HCL 0.1 MG TABS: 0.1 | 30 days supply | Qty: 30 | Fill #3

## 2018-05-01 MED FILL — CYCLOBENZAPRINE 10 MG TAB: 10 | 15 days supply | Qty: 30 | Fill #0

## 2018-05-20 MED FILL — VENTOLIN HFA 90 MCG INHALER: 108 (90 BAS | 25 days supply | Qty: 18 | Fill #2

## 2018-05-22 DIAGNOSIS — D649 Anemia, unspecified: Secondary | ICD-10-CM | POA: Diagnosis not present

## 2018-05-27 DIAGNOSIS — F4310 Post-traumatic stress disorder, unspecified: Secondary | ICD-10-CM | POA: Diagnosis not present

## 2018-05-27 MED FILL — PEG-3350 SOLUTION: 420 | 1 days supply | Qty: 4000 | Fill #0

## 2018-06-03 ENCOUNTER — Ambulatory Visit: Payer: Self-pay | Admitting: Family Medicine

## 2018-06-03 DIAGNOSIS — K649 Unspecified hemorrhoids: Secondary | ICD-10-CM | POA: Diagnosis not present

## 2018-06-03 DIAGNOSIS — D509 Iron deficiency anemia, unspecified: Secondary | ICD-10-CM | POA: Diagnosis not present

## 2018-06-08 DIAGNOSIS — F4310 Post-traumatic stress disorder, unspecified: Secondary | ICD-10-CM | POA: Diagnosis not present

## 2018-07-22 ENCOUNTER — Telehealth: Payer: Self-pay | Admitting: Family Medicine

## 2018-07-22 DIAGNOSIS — J4520 Mild intermittent asthma, uncomplicated: Secondary | ICD-10-CM

## 2018-07-22 NOTE — Telephone Encounter (Signed)
1) Medication(s) Requested (by name): albuterol (VENTOLIN HFA) 108 (90 Base) MCG/ACT inhaler [[id number]]   2) Pharmacy of Choice:   [place]

## 2018-07-23 MED ORDER — ALBUTEROL SULFATE HFA 108 (90 BASE) MCG/ACT IN AERS: 2.0000 | INHALATION_SPRAY | Freq: Four times a day (QID) | RESPIRATORY_TRACT | 2 refills | Status: DC | PRN

## 2018-07-23 NOTE — Telephone Encounter (Signed)
Refilled

## 2018-07-24 MED FILL — VENTOLIN HFA 90 MCG INHALER: 108 (90 BAS | 25 days supply | Qty: 18 | Fill #0

## 2018-08-04 ENCOUNTER — Ambulatory Visit: Payer: Self-pay | Admitting: Family Medicine

## 2018-08-12 ENCOUNTER — Other Ambulatory Visit: Payer: Self-pay | Admitting: Pharmacist

## 2018-08-12 MED ORDER — ALBUTEROL SULFATE HFA 108 (90 BASE) MCG/ACT IN AERS: 2.0000 | INHALATION_SPRAY | Freq: Four times a day (QID) | RESPIRATORY_TRACT | 2 refills | Status: DC | PRN

## 2018-09-21 ENCOUNTER — Encounter: Payer: Self-pay | Admitting: Family Medicine

## 2018-09-21 ENCOUNTER — Ambulatory Visit: Payer: Medicare Other | Attending: Family Medicine | Admitting: Family Medicine

## 2018-09-21 DIAGNOSIS — R232 Flushing: Secondary | ICD-10-CM | POA: Diagnosis not present

## 2018-09-21 DIAGNOSIS — R202 Paresthesia of skin: Secondary | ICD-10-CM

## 2018-09-21 DIAGNOSIS — M546 Pain in thoracic spine: Secondary | ICD-10-CM

## 2018-09-21 DIAGNOSIS — G4709 Other insomnia: Secondary | ICD-10-CM

## 2018-09-21 DIAGNOSIS — G8929 Other chronic pain: Secondary | ICD-10-CM

## 2018-09-21 DIAGNOSIS — I10 Essential (primary) hypertension: Secondary | ICD-10-CM | POA: Diagnosis not present

## 2018-09-21 MED ORDER — GABAPENTIN 300 MG PO CAPS: 300.0000 mg | ORAL_CAPSULE | Freq: Two times a day (BID) | ORAL | 1 refills | Status: DC

## 2018-09-21 MED ORDER — CLONIDINE HCL 0.1 MG PO TABS: 0.1000 mg | ORAL_TABLET | Freq: Every day | ORAL | 1 refills | Status: DC

## 2018-09-21 MED ORDER — LISINOPRIL 10 MG PO TABS: 10.0000 mg | ORAL_TABLET | Freq: Every day | ORAL | 1 refills | Status: DC

## 2018-09-21 MED ORDER — TIZANIDINE HCL 4 MG PO TABS: 4.0000 mg | ORAL_TABLET | Freq: Three times a day (TID) | ORAL | 1 refills | Status: DC | PRN

## 2018-09-21 MED FILL — LISINOPRIL 10 MG TABS: 10 | 90 days supply | Qty: 90 | Fill #0

## 2018-09-21 MED FILL — GABAPENTIN 300 MG CAPSULE: 300 | 30 days supply | Qty: 60 | Fill #0

## 2018-09-21 MED FILL — cloNIDine HCL 0.1 MG TABS: 0.1 | 90 days supply | Qty: 90 | Fill #0

## 2018-09-21 MED FILL — tiZANidine HCL 4 MG TABS: 4 | 30 days supply | Qty: 90 | Fill #0

## 2018-09-21 NOTE — Progress Notes (Signed)
Patient has been called and DOB has been verified. Patient has been screened and transferred to PCP to start phone visit.   Pain in right side due to fall.
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
## 2018-10-15 MED FILL — PROAIR HFA 90 MCG INHALER: 108 (90 BAS | 25 days supply | Qty: 9 | Fill #0

## 2018-10-29 ENCOUNTER — Telehealth: Payer: Self-pay | Admitting: Family Medicine

## 2018-10-29 MED FILL — PROAIR HFA 90 MCG INHALER: 108 (90 BAS | 25 days supply | Qty: 9 | Fill #0

## 2018-10-29 NOTE — Telephone Encounter (Signed)
Pt had a refill available at the pharmacy, refill was submitted and will be ready for pickup later today.

## 2018-10-29 NOTE — Telephone Encounter (Signed)
New Message   1) Medication(s) Requested (by name): albuterol (PROAIR HFA) 108 (90 Base) MCG/ACT inhaler    2) [facility] [place]  3) Special Requests:   Approved medications will be sent to the pharmacy, we will reach out if there is an issue.  Requests made after 3pm may not be addressed until the following business day!  If a patient is unsure of the name of the medication(s) please note and ask patient to call back when they are able to provide all info, do not send to responsible party until all information is available!

## 2018-12-04 MED FILL — GABAPENTIN 300 MG CAPSULE: 300 | 30 days supply | Qty: 60 | Fill #1

## 2019-01-06 ENCOUNTER — Encounter: Payer: Self-pay | Admitting: Family Medicine

## 2019-01-06 ENCOUNTER — Ambulatory Visit: Payer: Medicare Other | Attending: Family Medicine | Admitting: Family Medicine

## 2019-01-06 ENCOUNTER — Other Ambulatory Visit: Payer: Self-pay

## 2019-01-06 VITALS — BP 190/117 | HR 80 | Temp 98.5°F | Ht 61.0 in | Wt 182.0 lb

## 2019-01-06 DIAGNOSIS — G4709 Other insomnia: Secondary | ICD-10-CM

## 2019-01-06 DIAGNOSIS — R232 Flushing: Secondary | ICD-10-CM

## 2019-01-06 DIAGNOSIS — D6489 Other specified anemias: Secondary | ICD-10-CM

## 2019-01-06 DIAGNOSIS — Z1239 Encounter for other screening for malignant neoplasm of breast: Secondary | ICD-10-CM

## 2019-01-06 DIAGNOSIS — M546 Pain in thoracic spine: Secondary | ICD-10-CM

## 2019-01-06 DIAGNOSIS — R21 Rash and other nonspecific skin eruption: Secondary | ICD-10-CM

## 2019-01-06 DIAGNOSIS — G4700 Insomnia, unspecified: Secondary | ICD-10-CM

## 2019-01-06 DIAGNOSIS — Z23 Encounter for immunization: Secondary | ICD-10-CM

## 2019-01-06 DIAGNOSIS — R202 Paresthesia of skin: Secondary | ICD-10-CM

## 2019-01-06 DIAGNOSIS — I10 Essential (primary) hypertension: Secondary | ICD-10-CM

## 2019-01-06 DIAGNOSIS — M545 Low back pain: Secondary | ICD-10-CM

## 2019-01-06 DIAGNOSIS — Z1159 Encounter for screening for other viral diseases: Secondary | ICD-10-CM

## 2019-01-06 MED ORDER — GABAPENTIN 300 MG PO CAPS: 300.0000 mg | ORAL_CAPSULE | Freq: Two times a day (BID) | ORAL | 1 refills | Status: DC

## 2019-01-06 MED ORDER — CLONIDINE HCL 0.1 MG PO TABS: 0.1000 mg | ORAL_TABLET | Freq: Every day | ORAL | 1 refills | Status: DC

## 2019-01-06 MED ORDER — ALBUTEROL SULFATE HFA 108 (90 BASE) MCG/ACT IN AERS: 2.0000 | INHALATION_SPRAY | Freq: Four times a day (QID) | RESPIRATORY_TRACT | 1 refills | Status: DC | PRN

## 2019-01-06 MED ORDER — LISINOPRIL 10 MG PO TABS: 10.0000 mg | ORAL_TABLET | Freq: Every day | ORAL | 1 refills | Status: DC

## 2019-01-06 MED ORDER — TIZANIDINE HCL 4 MG PO TABS: 4.0000 mg | ORAL_TABLET | Freq: Three times a day (TID) | ORAL | 1 refills | Status: DC | PRN

## 2019-01-06 MED ORDER — HYDROCORTISONE 0.5 % EX CREA: 1.0000 "application " | TOPICAL_CREAM | Freq: Two times a day (BID) | CUTANEOUS | 0 refills | Status: AC

## 2019-01-06 MED ORDER — CLONIDINE HCL 0.1 MG PO TABS
0.1000 mg | ORAL_TABLET | Freq: Once | ORAL | Status: AC
  Administered 2019-01-06: 0.1 mg via ORAL

## 2019-01-06 MED FILL — LISINOPRIL 10 MG TABS: 10 | 30 days supply | Qty: 30 | Fill #0

## 2019-01-06 MED FILL — GABAPENTIN 300 MG CAPSULE: 300 | 30 days supply | Qty: 60 | Fill #0

## 2019-01-06 MED FILL — cloNIDine HCL 0.1 MG TABS: 0.1 | 30 days supply | Qty: 30 | Fill #0

## 2019-01-06 MED FILL — tiZANidine HCL 4 MG TABS: 4 | 30 days supply | Qty: 90 | Fill #0

## 2019-01-06 MED FILL — PROAIR HFA 90 MCG INHALER: 108 (90 BAS | 25 days supply | Qty: 9 | Fill #0

## 2019-01-06 NOTE — Patient Instructions (Signed)
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
## 2019-01-06 NOTE — Progress Notes (Signed)
Patient has been out of medications for 1 week.  Patient is having pain in her back.  Patient states that she ca not stand no more than 15 min without her back hurting.  Patient is having problems sleeping.  Patient needs refills on all medications.

## 2019-01-08 ENCOUNTER — Other Ambulatory Visit: Payer: Medicare Other

## 2019-02-26 ENCOUNTER — Other Ambulatory Visit: Payer: Self-pay

## 2019-02-26 NOTE — Patient Outreach (Signed)
[facility]) Care Management  02/26/2019  Stacie Wolfe November 15, 1965 [id number]   Medication Adherence call to Stacie Wolfe patient telephone number belongs to some one else,patient is past due on Lisinopril 10 mg under [place].   [place] Management Direct Dial [phone]  Fax [phone] [email or web address]

## 2019-03-02 MED FILL — LISINOPRIL 10 MG TABS: 10 | 90 days supply | Qty: 90 | Fill #1

## 2019-03-02 MED FILL — cloNIDine HCL 0.1 MG TABS: 0.1 | 90 days supply | Qty: 90 | Fill #1

## 2019-03-02 MED FILL — PROAIR HFA 90 MCG INHALER: 108 (90 BAS | 25 days supply | Qty: 9 | Fill #1

## 2019-03-02 MED FILL — GABAPENTIN 300 MG CAPSULE: 300 | 30 days supply | Qty: 60 | Fill #1

## 2019-03-02 MED FILL — tiZANidine HCL 4 MG TABS: 4 | 30 days supply | Qty: 90 | Fill #1

## 2019-03-11 MED FILL — GABAPENTIN 300 MG CAPSULE: 300 | 30 days supply | Qty: 60 | Fill #1

## 2019-03-11 MED FILL — tiZANidine HCL 4 MG TABS: 4 | 30 days supply | Qty: 90 | Fill #1

## 2019-03-11 MED FILL — LISINOPRIL 10 MG TABS: 10 | 90 days supply | Qty: 90 | Fill #1

## 2019-03-11 MED FILL — cloNIDine HCL 0.1 MG TABS: 0.1 | 30 days supply | Qty: 30 | Fill #1

## 2019-04-12 ENCOUNTER — Ambulatory Visit: Payer: Medicare Other | Admitting: Family Medicine

## 2019-04-12 ENCOUNTER — Other Ambulatory Visit: Payer: Self-pay | Admitting: Pharmacist

## 2019-04-12 MED ORDER — ALBUTEROL SULFATE HFA 108 (90 BASE) MCG/ACT IN AERS: 2.0000 | INHALATION_SPRAY | Freq: Four times a day (QID) | RESPIRATORY_TRACT | 0 refills | Status: AC | PRN

## 2019-04-12 MED FILL — cloNIDine HCL 0.1 MG TABS: 0.1 | 30 days supply | Qty: 30 | Fill #2

## 2019-04-12 MED FILL — PROAIR HFA 90 MCG INHALER: 108 (90 BAS | 25 days supply | Qty: 9 | Fill #0

## 2019-05-03 ENCOUNTER — Ambulatory Visit: Payer: Medicare Other | Attending: Family Medicine | Admitting: Family Medicine

## 2019-05-03 ENCOUNTER — Encounter: Payer: Self-pay | Admitting: Family Medicine

## 2019-05-03 ENCOUNTER — Other Ambulatory Visit: Payer: Self-pay

## 2019-05-03 DIAGNOSIS — M545 Low back pain: Secondary | ICD-10-CM | POA: Diagnosis not present

## 2019-05-03 DIAGNOSIS — R202 Paresthesia of skin: Secondary | ICD-10-CM

## 2019-05-03 DIAGNOSIS — I10 Essential (primary) hypertension: Secondary | ICD-10-CM | POA: Diagnosis not present

## 2019-05-03 DIAGNOSIS — N951 Menopausal and female climacteric states: Secondary | ICD-10-CM

## 2019-05-03 DIAGNOSIS — M546 Pain in thoracic spine: Secondary | ICD-10-CM

## 2019-05-03 DIAGNOSIS — G4709 Other insomnia: Secondary | ICD-10-CM

## 2019-05-03 MED ORDER — TIZANIDINE HCL 4 MG PO TABS: 4.0000 mg | ORAL_TABLET | Freq: Three times a day (TID) | ORAL | 6 refills | Status: DC | PRN

## 2019-05-03 MED ORDER — GABAPENTIN 300 MG PO CAPS: 600.0000 mg | ORAL_CAPSULE | Freq: Two times a day (BID) | ORAL | 6 refills | Status: DC

## 2019-05-03 MED ORDER — CLONIDINE HCL 0.1 MG PO TABS: 0.1000 mg | ORAL_TABLET | Freq: Every day | ORAL | 6 refills | Status: AC

## 2019-05-03 MED ORDER — DULOXETINE HCL 60 MG PO CPEP: 60.0000 mg | ORAL_CAPSULE | Freq: Every day | ORAL | 6 refills | Status: DC

## 2019-05-03 MED ORDER — LISINOPRIL 10 MG PO TABS: 10.0000 mg | ORAL_TABLET | Freq: Every day | ORAL | 6 refills | Status: AC

## 2019-05-03 NOTE — Progress Notes (Signed)
Patient states that she has pain in her abdomen while eating. Symptoms lasting 2 weeks.  Patient has not had BP medication in 3 days.

## 2019-05-03 NOTE — Patient Instructions (Signed)
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
## 2019-05-04 LAB — BASIC METABOLIC PANEL
BUN/Creatinine Ratio: 15 (ref 9–23)
BUN: 9 mg/dL (ref 6–24)
CO2: 19 mmol/L — ABNORMAL LOW (ref 20–29)
Calcium: 10.1 mg/dL (ref 8.7–10.2)
Chloride: 108 mmol/L — ABNORMAL HIGH (ref 96–106)
Creatinine, Ser: 0.59 mg/dL (ref 0.57–1.00)
GFR calc Af Amer: 121 mL/min/{1.73_m2} (ref >59)
GFR calc non Af Amer: 105 mL/min/{1.73_m2} (ref >59)
Glucose: 99 mg/dL (ref 65–99)
Potassium: 3.8 mmol/L (ref 3.5–5.2)
Sodium: 143 mmol/L (ref 134–144)

## 2019-05-04 MED FILL — GABAPENTIN 300 MG CAPSULE: 300 | 30 days supply | Qty: 120 | Fill #0

## 2019-05-04 MED FILL — tiZANidine HCL 4 MG TABS: 4 | 20 days supply | Qty: 60 | Fill #0

## 2019-05-04 MED FILL — DULoxetine HCL 60 MG CPEP: 60 | 30 days supply | Qty: 30 | Fill #0

## 2019-05-24 ENCOUNTER — Other Ambulatory Visit: Payer: Self-pay

## 2019-05-24 ENCOUNTER — Ambulatory Visit
Admission: RE | Admit: 2019-05-24 | Discharge: 2019-05-24 | Disposition: A | Discharge status: Home / Self Care | Payer: Medicare Other | Source: Ambulatory Visit | Attending: Family Medicine | Admitting: Family Medicine

## 2019-05-24 DIAGNOSIS — Z1231 Encounter for screening mammogram for malignant neoplasm of breast: Secondary | ICD-10-CM | POA: Diagnosis not present

## 2019-05-24 DIAGNOSIS — Z1239 Encounter for other screening for malignant neoplasm of breast: Secondary | ICD-10-CM

## 2019-05-26 MED FILL — PROAIR HFA 90 MCG INHALER: 108 (90 BAS | 25 days supply | Qty: 9 | Fill #1

## 2019-06-18 ENCOUNTER — Encounter: Payer: Self-pay | Admitting: Physical Medicine and Rehabilitation

## 2019-06-29 ENCOUNTER — Encounter
Payer: Medicare Other | Attending: Physical Medicine and Rehabilitation | Admitting: Physical Medicine and Rehabilitation

## 2019-06-29 ENCOUNTER — Encounter: Payer: Self-pay | Admitting: Physical Medicine and Rehabilitation

## 2019-06-29 ENCOUNTER — Other Ambulatory Visit: Payer: Self-pay

## 2019-06-29 DIAGNOSIS — M546 Pain in thoracic spine: Secondary | ICD-10-CM | POA: Diagnosis not present

## 2019-06-29 DIAGNOSIS — M545 Low back pain, unspecified: Secondary | ICD-10-CM

## 2019-06-29 DIAGNOSIS — R202 Paresthesia of skin: Secondary | ICD-10-CM | POA: Insufficient documentation

## 2019-06-29 MED ORDER — DULOXETINE HCL 60 MG PO CPEP: 60.0000 mg | ORAL_CAPSULE | Freq: Every day | ORAL | 6 refills | Status: AC

## 2019-06-29 MED ORDER — GABAPENTIN 300 MG PO CAPS: 600.0000 mg | ORAL_CAPSULE | Freq: Three times a day (TID) | ORAL | 6 refills | Status: DC

## 2019-06-29 MED FILL — GABAPENTIN 300 MG CAPSULE: 300 | 20 days supply | Qty: 120 | Fill #0

## 2019-06-29 MED FILL — DULoxetine HCL 60 MG CPEP: 60 | 30 days supply | Qty: 30 | Fill #0

## 2019-06-29 NOTE — Progress Notes (Signed)
Subjective:    Patient ID: Stacie Wolfe, female    DOB: 10/13/1965, 54 y.o.   MRN: [id number]  HPI  History obtained from patient, sign language interpreter, and chart review.   Stacie Wolfe is a 54 year old woman who is deaf and has a history of CVA, asthma, anemia, chronic right hip pain, HTN, who presents to establish care for chronic thoracolumbar back pain.  PDMP reviewed. Last prescribed narcotic Tramadol in 2019.   Current pain medications include Gabapentin 300mg  BID and Tizanidine 4mg  q8H prn for muscle spasms. At her last PCP appointment on 12/21 she was also started on Cymbalta daily.   Stacie Wolfe reports that she has been having right sided thoracolumbar back pain for many months that radiates into her right leg. She has had good relief from Gabapentin and Cymbalta and the former does not make her too sleepy. She needs refills for both. Her pain is worst with extension and better with flexion. 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 She has never had any spinal imaging or physical therapy and would be willing to do both.    Pain Inventory Average Pain 8 Pain Right Now 8 My pain is tingling and aching  In the last 24 hours, has pain interfered with the following? General activity n/a Relation with others n/a Enjoyment of life n/a What TIME of day is your pain at its worst? all Sleep (in general) Poor  Pain is worse with: walking, bending and sitting Pain improves with: nothing Relief from Meds: 0  Mobility walk without assistance how many minutes can you walk? 20 ability to climb steps?  no do you drive?  no transfers alone  Function employed # of hrs/week 20  Neuro/Psych numbness  Prior Studies new  Physicians involved in your care new   Family History  Problem Relation Age of Onset  . Cancer Mother   . Lymphoma Mother   . Cancer Other   . Diabetes Neg Hx    Social History   Socioeconomic History  . Marital status:  Soil scientist    Spouse name: Not on file  . Number of children: Not on file  . Years of education: Not on file  . Highest education level: Not on file  Occupational History  . Not on file  Tobacco Use  . Smoking status: Former Research scientist (life sciences)  . Smokeless tobacco: Never Used  Substance and Sexual Activity  . Alcohol use: Yes    Comment: weekends   . Drug use: No  . Sexual activity: Yes    Birth control/protection: None  Other Topics Concern  . Not on file  Social History Narrative   ** Merged History Encounter **       Social Determinants of Health   Financial Resource Strain:   . Difficulty of Paying Living Expenses: Not on file  Food Insecurity:   . Worried About Charity fundraiser in the Last Year: Not on file  . Ran Out of Food in the Last Year: Not on file  Transportation Needs:   . Lack of Transportation (Medical): Not on file  . Lack of Transportation (Non-Medical): Not on file  Physical Activity:   . Days of Exercise per Week: Not on file  . Minutes of Exercise per Session: Not on file  Stress:   . Feeling of Stress : Not on file  Social Connections:   . Frequency of Communication with Friends and Family:  Not on file  . Frequency of Social Gatherings with Friends and Family: Not on file  . Attends Religious Services: Not on file  . Active Member of Clubs or Organizations: Not on file  . Attends Archivist Meetings: Not on file  . Marital Status: Not on file   Past Surgical History:  Procedure Laterality Date  . ABDOMINAL SURGERY    . CHOLECYSTECTOMY    . COSMETIC SURGERY    . GASTRIC BYPASS    . TUBAL LIGATION     Past Medical History:  Diagnosis Date  . Asthma   . Deaf   . Depression   . Hypertension   . Migraine   . Stroke (HCC)    Temp 97.9 F (36.6 C)   Ht 5\' 3"  (1.6 m)   Wt 172 lb (78 kg)   BMI 30.47 kg/m   Opioid Risk Score:   Fall Risk Score:  `1  Depression screen PHQ 2/9  Depression screen [facility] 2/9 06/29/2019 03/19/2018  03/03/2018 12/01/2017 10/08/2017  Decreased Interest 0 1 0 2 0  Down, Depressed, Hopeless 2 1 3 2 2   PHQ - 2 Score 2 2 3 4 2   Altered sleeping 0 0 1 0 2  Tired, decreased energy 1 2 3 2 2   Change in appetite 1 0 0 0 0  Feeling bad or failure about yourself  1 0 2 1 2   Trouble concentrating 0 0 3 2 0  Moving slowly or fidgety/restless 0 0 0 0 0  Suicidal thoughts 0 0 0 0 1  PHQ-9 Score 5 4 12 9 9     Review of Systems  Constitutional: Negative.   HENT: Positive for hearing loss.   Eyes: Negative.   Respiratory: Negative.   Cardiovascular: Negative.   Gastrointestinal: Negative.   Genitourinary: Negative.   Musculoskeletal: Positive for arthralgias and back pain.  Allergic/Immunologic: Negative.   Neurological: Positive for numbness.  Psychiatric/Behavioral: Negative.        Objective:   Physical Exam  Gen: no distress, normal appearing HEENT: oral mucosa pink and moist, NCAT. Deaf. Communicates via sign language interpreter.  Cardio: Reg rate Chest: normal effort, normal rate of breathing Abd: soft, non-distended Ext: no edema Skin: intact Neuro: AOx3 Musculoskeletal: 5/5 strength throughout. Right sided slump test is positive, negative on left. Tender to palpation in right thoracolumbar junction. Full flexion but extension limited by pain.  Psych: pleasant, normal affect      Assessment & Plan:  Stacie Wolfe is a 53 year old woman who is deaf and has a history of CVA, asthma, anemia, chronic right hip pain, HTN, who presents to establish care for chronic thoracolumbar back pain.  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 --I recommend physical therapy for core strengthening and paraspinal strengthening and development of HEP. --XR of lumbar spine. I will call with results once available. --I have increased Gabapentin dose to 300mg  TID and renewed Cymbata dose of 60mg   daily. --Discussed the importance of an anti-inflammatory diet. Patient is agreeable to eating salmon once per week and adding turmeric powder to some of her meals. She does not like walnuts.   RTC in 4 weeks to assess progress with above interventions.

## 2019-07-27 ENCOUNTER — Other Ambulatory Visit: Payer: Self-pay

## 2019-07-27 ENCOUNTER — Encounter
Payer: Medicare Other | Attending: Physical Medicine and Rehabilitation | Admitting: Physical Medicine and Rehabilitation

## 2019-07-27 ENCOUNTER — Encounter: Payer: Self-pay | Admitting: Physical Medicine and Rehabilitation

## 2019-07-27 DIAGNOSIS — R202 Paresthesia of skin: Secondary | ICD-10-CM | POA: Insufficient documentation

## 2019-07-27 DIAGNOSIS — M545 Low back pain: Secondary | ICD-10-CM | POA: Diagnosis not present

## 2019-07-27 DIAGNOSIS — M546 Pain in thoracic spine: Secondary | ICD-10-CM | POA: Insufficient documentation

## 2019-07-27 MED ORDER — TIZANIDINE HCL 4 MG PO TABS: 4.0000 mg | ORAL_TABLET | Freq: Three times a day (TID) | ORAL | 6 refills | Status: AC | PRN

## 2019-07-27 MED ORDER — GABAPENTIN 400 MG PO CAPS: 400.0000 mg | ORAL_CAPSULE | Freq: Three times a day (TID) | ORAL | 0 refills | Status: AC

## 2019-07-27 MED FILL — tiZANidine HCL 4 MG TABS: 4 | 20 days supply | Qty: 60 | Fill #0

## 2019-07-27 MED FILL — GABAPENTIN 400 MG CAPSULE: 400 | 30 days supply | Qty: 90 | Fill #0

## 2019-07-27 NOTE — Progress Notes (Signed)
Subjective:    Patient ID: Stacie Wolfe, female    DOB: Sep 16, 1965, 54 y.o.   MRN: [id number]  HPI  History obtained from patient, sign language interpreter, and chart review.   [name] is a 54 year old woman who is deaf and has a history of CVA, asthma, anemia, chronic right hip pain, HTN, who presents for follow-up of chronic thoracolumbar back pain.  PDMP reviewed. Last prescribed narcotic Tramadol in 2019.   Current pain medications include Gabapentin 300mg  TID, Tizanidine 4mg  q8H prn for muscle spasms, and Cymbalta 60mg  daily.  [name] reports that she has been having right sided thoracolumbar back pain for many months that radiates into her right leg. She initially had good relief from Gabapentin and Cymbalta and they do not make her too sleepy. She needs refills for these medications. She cannot remember whether she tool Tizanidine. Her pain is worst with extension and better with flexion. 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 She has never had any spinal imaging or physical therapy and would be willing to do both. She never received a call from PT or radiology to schedule these sessions/imaging.   Sleeping poorly at night only 3-4 hours.    Pain Inventory Average Pain 8 Pain Right Now 8 My pain is aching  In the last 24 hours, has pain interfered with the following? General activity 0 Relation with others 8 Enjoyment of life 8 What TIME of day is your pain at its worst? all Sleep (in general) Fair  Pain is worse with: bending, sitting and standing Pain improves with: medication Relief from Meds: 5  Mobility ability to climb steps?  no do you drive?  no  Function employed # of hrs/week 20  Neuro/Psych depression anxiety  Prior Studies Any changes since last visit?  no  Physicians involved in your care Any changes since last visit?  no   Family History  Problem Relation Age of Onset  . Cancer Mother   . Lymphoma  Mother   . Cancer Other   . Diabetes Neg Hx    Social History   Socioeconomic History  . Marital status: Soil scientist    Spouse name: Not on file  . Number of children: Not on file  . Years of education: Not on file  . Highest education level: Not on file  Occupational History  . Not on file  Tobacco Use  . Smoking status: Former Research scientist (life sciences)  . Smokeless tobacco: Never Used  Substance and Sexual Activity  . Alcohol use: Yes    Comment: weekends   . Drug use: No  . Sexual activity: Yes    Birth control/protection: None  Other Topics Concern  . Not on file  Social History Narrative   ** Merged History Encounter **       Social Determinants of Health   Financial Resource Strain:   . Difficulty of Paying Living Expenses:   Food Insecurity:   . Worried About Charity fundraiser in the Last Year:   . Arboriculturist in the Last Year:   Transportation Needs:   . Film/video editor (Medical):   Marland Kitchen Lack of Transportation (Non-Medical):   Physical Activity:   . Days of Exercise per Week:   . Minutes of Exercise per Session:   Stress:   . Feeling of Stress :   Social Connections:   . Frequency of Communication with Friends and Family:   .  Frequency of Social Gatherings with Friends and Family:   . Attends Religious Services:   . Active Member of Clubs or Organizations:   . Attends Archivist Meetings:   Marland Kitchen Marital Status:    Past Surgical History:  Procedure Laterality Date  . ABDOMINAL SURGERY    . CHOLECYSTECTOMY    . COSMETIC SURGERY    . GASTRIC BYPASS    . TUBAL LIGATION     Past Medical History:  Diagnosis Date  . Asthma   . Deaf   . Depression   . Hypertension   . Migraine   . Stroke [facility])    There were no vitals taken for this visit.  Opioid Risk Score:   Fall Risk Score:  `1  Depression screen PHQ 2/9  Depression screen [facility] 2/9 06/29/2019 03/19/2018 03/03/2018 12/01/2017 10/08/2017  Decreased Interest 0 1 0 2 0  Down, Depressed,  Hopeless 2 1 3 2 2   PHQ - 2 Score 2 2 3 4 2   Altered sleeping 0 0 1 0 2  Tired, decreased energy 1 2 3 2 2   Change in appetite 1 0 0 0 0  Feeling bad or failure about yourself  1 0 2 1 2   Trouble concentrating 0 0 3 2 0  Moving slowly or fidgety/restless 0 0 0 0 0  Suicidal thoughts 0 0 0 0 1  PHQ-9 Score 5 4 12 9 9      Review of Systems  Constitutional: Negative.   HENT: Negative.   Eyes: Negative.   Respiratory: Negative.   Cardiovascular: Negative.   Gastrointestinal: Negative.   Endocrine: Negative.   Genitourinary: Negative.   Musculoskeletal: Positive for arthralgias and back pain.  Skin: Negative.   Allergic/Immunologic: Negative.   Neurological: Negative.   Hematological: Negative.   Psychiatric/Behavioral: Positive for dysphoric mood. The patient is nervous/anxious.   All other systems reviewed and are negative.      Objective:   Physical Exam Gen: no distress, normal appearing HEENT: oral mucosa pink and moist, NCAT. Deaf. Communicates via sign language interpreter.  Cardio: Reg rate Chest: normal effort, normal rate of breathing Abd: soft, non-distended Ext: no edema Skin: intact Neuro: AOx3 Musculoskeletal: 5/5 strength throughout. Right sided slump test is positive, negative on left. Tender to palpation in right thoracolumbar junction. Full flexion but extension limited by pain. Normal gait.  Psych: pleasant, normal affect       Assessment & Plan:  [name] is a 54 year old woman who is deaf and has a history of CVA, asthma, anemia, chronic right hip pain, HTN, who presents to establish care for chronic thoracolumbar back pain.  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 --I recommend physical therapy for core strengthening and paraspinal strengthening and development of HEP. Provided new referral.  --XR of lumbar spine. I will call with results  once available. Advised that she can walk into [facility] to get this image.  --I have increased Gabapentin dose to 400mg  TID and renewed Cymbata dose of 60mg  daily as well as Tizanidine 4mg  q8H prn. The latter should help with insomnia as well.  --Discussed the importance of an anti-inflammatory diet. Patient is agreeable to eating salmon once per week and adding turmeric powder to some of her meals. She does not like walnuts.   RTC in 4 weeks to assess progress with above interventions.

## 2019-07-28 MED FILL — GABAPENTIN 300 MG CAPSULE: 300 | 20 days supply | Qty: 120 | Fill #1

## 2019-07-29 ENCOUNTER — Ambulatory Visit: Payer: Medicare Other | Attending: Physical Medicine and Rehabilitation | Admitting: Physical Therapy

## 2019-08-04 ENCOUNTER — Ambulatory Visit: Payer: Medicare Other | Admitting: Family Medicine

## 2019-08-09 ENCOUNTER — Ambulatory Visit: Payer: Medicare Other | Admitting: Family Medicine

## 2019-08-20 ENCOUNTER — Ambulatory Visit: Payer: Medicare Other | Attending: Family Medicine

## 2019-08-24 ENCOUNTER — Encounter
Payer: Medicare Other | Attending: Physical Medicine and Rehabilitation | Admitting: Physical Medicine and Rehabilitation

## 2019-08-24 DIAGNOSIS — R202 Paresthesia of skin: Secondary | ICD-10-CM | POA: Insufficient documentation

## 2019-08-24 DIAGNOSIS — M545 Low back pain: Secondary | ICD-10-CM | POA: Insufficient documentation

## 2019-08-24 DIAGNOSIS — M546 Pain in thoracic spine: Secondary | ICD-10-CM | POA: Insufficient documentation

## 2019-08-30 ENCOUNTER — Ambulatory Visit: Payer: Medicare Other | Admitting: Family Medicine

## 2019-12-10 DIAGNOSIS — E538 Deficiency of other specified B group vitamins: Secondary | ICD-10-CM | POA: Diagnosis not present

## 2019-12-10 DIAGNOSIS — Z79899 Other long term (current) drug therapy: Secondary | ICD-10-CM | POA: Diagnosis not present

## 2019-12-10 DIAGNOSIS — Z23 Encounter for immunization: Secondary | ICD-10-CM | POA: Diagnosis not present

## 2019-12-10 DIAGNOSIS — I10 Essential (primary) hypertension: Secondary | ICD-10-CM | POA: Diagnosis not present

## 2019-12-10 DIAGNOSIS — Z1159 Encounter for screening for other viral diseases: Secondary | ICD-10-CM | POA: Diagnosis not present

## 2019-12-10 DIAGNOSIS — H547 Unspecified visual loss: Secondary | ICD-10-CM | POA: Diagnosis not present

## 2019-12-14 ENCOUNTER — Other Ambulatory Visit: Payer: Self-pay | Admitting: Student

## 2019-12-14 DIAGNOSIS — R748 Abnormal levels of other serum enzymes: Secondary | ICD-10-CM

## 2019-12-29 ENCOUNTER — Other Ambulatory Visit: Payer: Medicare Other

## 2020-07-10 ENCOUNTER — Other Ambulatory Visit: Payer: Self-pay | Admitting: Family Medicine

## 2020-07-10 DIAGNOSIS — Z1231 Encounter for screening mammogram for malignant neoplasm of breast: Secondary | ICD-10-CM

## 2020-08-31 ENCOUNTER — Ambulatory Visit: Payer: Medicare Other

## 2020-08-31 ENCOUNTER — Inpatient Hospital Stay: Admission: RE | Admit: 2020-08-31 | Payer: Medicare Other | Source: Ambulatory Visit

## 2020-10-30 ENCOUNTER — Other Ambulatory Visit: Payer: Self-pay | Admitting: Student

## 2020-10-30 DIAGNOSIS — Z1231 Encounter for screening mammogram for malignant neoplasm of breast: Secondary | ICD-10-CM

## 2020-11-02 ENCOUNTER — Ambulatory Visit
Admission: RE | Admit: 2020-11-02 | Discharge: 2020-11-02 | Disposition: A | Discharge status: Home / Self Care | Payer: Medicare Other | Source: Ambulatory Visit | Attending: Student | Admitting: Student

## 2020-11-02 ENCOUNTER — Other Ambulatory Visit: Payer: Self-pay

## 2020-11-02 DIAGNOSIS — Z1231 Encounter for screening mammogram for malignant neoplasm of breast: Secondary | ICD-10-CM

## 2020-11-03 ENCOUNTER — Encounter: Payer: Self-pay | Admitting: General Practice

## 2020-11-03 NOTE — Progress Notes (Addendum)
[facility]  Clinical Social Work was referred by [facility]  to review and complete healthcare advance directives.  Clinical Primary school teacher met with patient Stacie Wolfe.  The patient designated Pete Pelt Sr as their primary healthcare agent and Aynsley Fleet as their secondary agent.  Patient also completed healthcare living will.    Clinical Social Worker notarized documents and made copies for patient/family. Clinical Social Worker will send documents to medical records to be scanned into patient's chart. Clinical Social Worker encouraged patient/family to contact with any additional questions or concerns.  Interpretation was provided by [employer] video [employer] interpreter with patient consent.   Edwyna Shell, LCSW Clinical Social Worker Phone:  [phone]

## 2020-12-05 ENCOUNTER — Ambulatory Visit: Payer: Medicare Other | Admitting: Podiatry

## 2021-06-20 ENCOUNTER — Telehealth: Payer: Self-pay

## 2021-06-20 NOTE — Telephone Encounter (Signed)
NOTES SCANNED TO REFERRAL 

## 2021-07-09 ENCOUNTER — Ambulatory Visit: Payer: Medicare Other | Admitting: Internal Medicine

## 2021-07-21 NOTE — Progress Notes (Deleted)
?Cardiology Office Note:   ? ?Date:  07/21/2021  ? ?ID:  Kc Sedlak, DOB July 02, 1965, MRN [id number] ? ?PCP:  Cipriano Mile, NP  ? ?[facility] ?Cardiologist:  Lenna Sciara, MD ?Referring MD: Cipriano Mile, NP  ? ?Chief Complaint/Reason for Referral: Chest pain ? ? ?ASSESSMENT:   ? ?Chest pain, unspecified type ? ?Primary hypertension ? ?Hyperlipidemia, unspecified hyperlipidemia type ? ?PLAN:   ? ?In order of problems listed above: ? ?1.  We will obtain a coronary CTA and echocardiogram to evaluate further.  If the patient has mild obstructive coronary artery disease, they will require a statin (with goal LDL < 70) and aspirin, if they have high-grade disease we will need to consider optimal medical therapy and if symptoms are refractory to medical therapy, then a cardiac catheterization with possible PCI will be pursued to alleviate symptoms.  If they have high risk disease we will proceed directly to cardiac catheterization.  We will keep follow-up with me open-ended depending on the results of this testing. ?2.  Hypertension ?3.  This is being managed by the patient's primary care provider. ? ? ?     ? ?{Are you ordering a CV Procedure (e.g. stress test, cath, DCCV, TEE, etc)?   Press F2        :[id number]}  ? ?Dispo:  No follow-ups on file.  ?  ? ?Medication Adjustments/Labs and Tests Ordered: ?Current medicines are reviewed at length with the patient today.  Concerns regarding medicines are outlined above.  ? ?Tests Ordered: ?No orders of the defined types were placed in this encounter. ? ? ?Medication Changes: ?No orders of the defined types were placed in this encounter. ? ? ?History of Present Illness:   ? ?FOCUSED PROBLEM LIST:   ?1.  Deafness requiring ASL interpreter ?2.  Hypertension ?3.  Hyperlipidemia ?4.  Prior alcohol abuse ? ? ?The patient is a 56 y.o. female with the indicated medical history here for recommendations regarding chest pain.  The patient saw her primary care  provider recently and had reported some episodes of chest pain. ?    ?  ?Previous Medical History: ?Past Medical History:  ?Diagnosis Date  ? Alcohol dependence syndrome ([place])   ? Anemia   ? Angina pectoris ([place])   ? Asthma   ? BMI 30.0-30.9,adult   ? Chronic back pain   ? Deaf   ? Decreased vision   ? Dental infection   ? Depression   ? Depressive disorder   ? Excess skin   ? Eye pain   ? Finger numbness   ? Grief   ? Hearing loss   ? HLD (hyperlipidemia)   ? Hyperparathyroidism ([place])   ? Hypertension   ? Migraine   ? Neck pain   ? Overweight   ? PTSD (post-traumatic stress disorder)   ? Stroke [facility])   ? Urinary incontinence   ? Usher syndrome   ? Vitamin B12 deficiency   ? Vitamin D deficiency   ? ? ? ?Current Medications: ?No outpatient medications have been marked as taking for the 07/24/21 encounter (Appointment) with Early Osmond, MD.  ?  ? ?Allergies:    ?Verapamil  ? ?Social History:   ?Social History  ? ?Tobacco Use  ? Smoking status: Former  ? Smokeless tobacco: Never  ?Vaping Use  ? Vaping Use: Never used  ?Substance Use Topics  ? Alcohol use: Yes  ?  Comment: weekends   ? Drug use: No  ?  ? ?  Family Hx: ?Family History  ?Problem Relation Age of Onset  ? Cancer Mother   ? Lymphoma Mother   ? Cancer Other   ? Diabetes Neg Hx   ?  ? ?Review of Systems:   ?Please see the history of present illness.    ?All other systems reviewed and are negative. ?  ? ? ?EKGs/Labs/Other Test Reviewed:   ? ?EKG: 2019 EKG she demonstrates normal sinus rhythm ? ?Prior CV studies: ?None available ? ?Imaging studies that I have independently reviewed today: None relevant ? ?Recent Labs: ?No results found for requested labs within last 8760 hours.  ? ?Recent Lipid Panel ?No results found for: CHOL, TRIG, HDL, LDLCALC, LDLDIRECT ? ?Risk Assessment/Calculations:   ? ?{Does this patient have ATRIAL FIBRILLATION?:[id number]} ?    ? ?Physical Exam:   ? ?VS:  There were no vitals taken for this visit.   ?Wt Readings from Last 3  Encounters:  ?07/27/19 174 lb (78.9 kg)  ?06/29/19 172 lb (78 kg)  ?05/03/19 168 lb (76.2 kg)  ?  ?GENERAL:  No apparent distress, AOx3 ?HEENT:  No carotid bruits, +2 carotid impulses, no scleral icterus ?CAR: RRR Irregular RR*** no murmurs***, gallops, rubs, or thrills ?RES:  Clear to auscultation bilaterally ?ABD:  Soft, nontender, nondistended, positive bowel sounds x 4 ?VASC:  +2 radial pulses, +2 carotid pulses, palpable pedal pulses ?NEURO:  CN 2-12 grossly intact; motor and sensory grossly intact ?PSYCH:  No active depression or anxiety ?EXT:  No edema, ecchymosis, or cyanosis ? ?Signed, ?Early Osmond, MD  ?07/21/2021 9:43 AM    ?[facility] ?[place], [place], [place]  [address] ?Phone: [phone]; Fax: [phone]  ? ?Note:  This document was prepared using Dragon voice recognition software and may include unintentional dictation errors. ?

## 2021-07-24 ENCOUNTER — Ambulatory Visit: Payer: Medicaid Other | Admitting: Internal Medicine

## 2021-07-24 DIAGNOSIS — R079 Chest pain, unspecified: Secondary | ICD-10-CM

## 2021-07-24 DIAGNOSIS — E785 Hyperlipidemia, unspecified: Secondary | ICD-10-CM

## 2021-07-24 DIAGNOSIS — I10 Essential (primary) hypertension: Secondary | ICD-10-CM

## 2021-08-11 DEATH — deceased
